# Patient Record
Sex: Male | Born: 1950 | Race: White | Hispanic: No | Marital: Married | Smoking: Never smoker
Health system: Southern US, Community
[De-identification: ages and names within clinical notes are randomized; demographics above are authoritative.]

## PROBLEM LIST (undated history)

## (undated) DIAGNOSIS — J302 Other seasonal allergic rhinitis: Secondary | ICD-10-CM

## (undated) HISTORY — DX: Other seasonal allergic rhinitis: J30.2

---

## 1998-07-25 HISTORY — PX: CHOLECYSTECTOMY: SHX55

## 2004-10-18 ENCOUNTER — Ambulatory Visit: Payer: Self-pay | Admitting: Family Medicine

## 2010-05-19 ENCOUNTER — Emergency Department: Payer: Self-pay | Admitting: Emergency Medicine

## 2012-02-18 ENCOUNTER — Inpatient Hospital Stay: Payer: Self-pay | Admitting: Internal Medicine

## 2012-02-18 LAB — BASIC METABOLIC PANEL
Anion Gap: 7 (ref 7–16)
Anion Gap: 8 (ref 7–16)
BUN: 20 mg/dL — ABNORMAL HIGH (ref 7–18)
Calcium, Total: 7.4 mg/dL — ABNORMAL LOW (ref 8.5–10.1)
Calcium, Total: 7.6 mg/dL — ABNORMAL LOW (ref 8.5–10.1)
Co2: 24 mmol/L (ref 21–32)
Co2: 25 mmol/L (ref 21–32)
Creatinine: 1.08 mg/dL (ref 0.60–1.30)
EGFR (African American): >60
EGFR (Non-African Amer.): >60
EGFR (Non-African Amer.): >60
Glucose: 119 mg/dL — ABNORMAL HIGH (ref 65–99)
Osmolality: 287 (ref 275–301)
Potassium: 4.4 mmol/L (ref 3.5–5.1)
Potassium: 4.8 mmol/L (ref 3.5–5.1)
Sodium: 142 mmol/L (ref 136–145)

## 2012-02-18 LAB — CBC
HCT: 34.3 % — ABNORMAL LOW (ref 40.0–52.0)
MCV: 90 fL (ref 80–100)
RBC: 3.81 10*6/uL — ABNORMAL LOW (ref 4.40–5.90)
RDW: 13 % (ref 11.5–14.5)
WBC: 12.4 10*3/uL — ABNORMAL HIGH (ref 3.8–10.6)

## 2012-02-18 LAB — HEMOGLOBIN
HGB: 10.2 g/dL — ABNORMAL LOW (ref 13.0–18.0)
HGB: 9.4 g/dL — ABNORMAL LOW (ref 13.0–18.0)

## 2012-02-18 LAB — APTT: Activated PTT: 24.2 secs (ref 23.6–35.9)

## 2012-02-18 LAB — PLATELET COUNT: Platelet: 236 10*3/uL (ref 150–440)

## 2012-02-19 LAB — URINALYSIS, COMPLETE
Bacteria: NONE SEEN
Bilirubin,UR: NEGATIVE
Glucose,UR: NEGATIVE mg/dL (ref 0–75)
Leukocyte Esterase: NEGATIVE
RBC,UR: NONE SEEN /HPF (ref 0–5)
Squamous Epithelial: NONE SEEN
WBC UR: <1 /HPF (ref 0–5)

## 2012-02-19 LAB — BASIC METABOLIC PANEL
Anion Gap: 6 — ABNORMAL LOW (ref 7–16)
BUN: 8 mg/dL (ref 7–18)
Chloride: 114 mmol/L — ABNORMAL HIGH (ref 98–107)
Co2: 27 mmol/L (ref 21–32)
Creatinine: 0.9 mg/dL (ref 0.60–1.30)
EGFR (Non-African Amer.): >60
Glucose: 93 mg/dL (ref 65–99)
Osmolality: 290 (ref 275–301)
Potassium: 3.9 mmol/L (ref 3.5–5.1)

## 2012-02-19 LAB — HEMOGLOBIN: HGB: 9.4 g/dL — ABNORMAL LOW (ref 13.0–18.0)

## 2012-02-20 LAB — HEMOGLOBIN: HGB: 9.1 g/dL — ABNORMAL LOW (ref 13.0–18.0)

## 2013-05-19 ENCOUNTER — Emergency Department: Payer: Self-pay | Admitting: Emergency Medicine

## 2013-05-19 LAB — COMPREHENSIVE METABOLIC PANEL
Albumin: 4.1 g/dL (ref 3.4–5.0)
BUN: 12 mg/dL (ref 7–18)
Calcium, Total: 9.2 mg/dL (ref 8.5–10.1)
Co2: 26 mmol/L (ref 21–32)
Creatinine: 1.14 mg/dL (ref 0.60–1.30)
EGFR (African American): >60
Osmolality: 277 (ref 275–301)
Potassium: 4.1 mmol/L (ref 3.5–5.1)
SGOT(AST): 19 U/L (ref 15–37)
SGPT (ALT): 20 U/L (ref 12–78)
Sodium: 139 mmol/L (ref 136–145)

## 2013-05-19 LAB — CBC
HGB: 15.4 g/dL (ref 13.0–18.0)
MCH: 31.1 pg (ref 26.0–34.0)
MCHC: 34.9 g/dL (ref 32.0–36.0)
MCV: 89 fL (ref 80–100)
RDW: 12.7 % (ref 11.5–14.5)
WBC: 6.9 10*3/uL (ref 3.8–10.6)

## 2013-05-19 LAB — URINALYSIS, COMPLETE
Bilirubin,UR: NEGATIVE
Blood: NEGATIVE
Nitrite: NEGATIVE
Ph: 6 (ref 4.5–8.0)
RBC,UR: NONE SEEN /HPF (ref 0–5)
Squamous Epithelial: <1
WBC UR: 1 /HPF (ref 0–5)

## 2014-02-11 DIAGNOSIS — N4341 Spermatocele of epididymis, single: Secondary | ICD-10-CM | POA: Insufficient documentation

## 2014-03-24 DIAGNOSIS — K589 Irritable bowel syndrome without diarrhea: Secondary | ICD-10-CM | POA: Insufficient documentation

## 2014-03-24 DIAGNOSIS — K2100 Gastro-esophageal reflux disease with esophagitis, without bleeding: Secondary | ICD-10-CM | POA: Insufficient documentation

## 2014-07-25 HISTORY — PX: HERNIA REPAIR: SHX51

## 2014-09-30 DIAGNOSIS — N400 Enlarged prostate without lower urinary tract symptoms: Secondary | ICD-10-CM | POA: Insufficient documentation

## 2014-11-11 NOTE — Consult Note (Signed)
PATIENT NAME:  Javier Archer, Javier Archer MR#:  [id number] DATE OF BIRTH:  05/13/1951  DATE OF CONSULTATION:  02/19/2012  CONSULTING PHYSICIAN:  Lupita Dawn. Candace Cruise, MD  REASON FOR REFERRAL: Lower GI bleeding.   HISTORY OF PRESENT ILLNESS: The patient is a 64 year old white male who had a routine colonoscopy on July 26th by Dr. Melina Copa in [place] for a screening colonoscopy. At that time, the patient had two 8 mm polyps removed from the ascending colon. The patient tolerated the procedure well. The patient resumed taking a baby aspirin that night. Then around 6:00 in the evening, the patient had some urgency and started passing a large amount of blood per rectum. The patient had a total of 6 bowel movements from 6 to 9:30 p.m. By this time, the patient was found to feel jittery and sweaty. He felt lightheaded and dizzy. He initially contacted Dr. Melina Copa. When the bleeding continued, he came to the Emergency Room for further evaluation.   The patient was found to be hypotensive with a systolic blood pressure in the low 90s. Whenever he would stand or sit up he would feel more dizzy. Because of symptomatic GI bleeding, the patient was admitted for further evaluation and treatment.   REVIEW OF SYSTEMS: The patient has no fevers or chills. He does complain of weakness. He denied having any nausea or vomiting. There is no abdominal pain or cramping. Since 09:30 last night, the patient has had no further bleeding. There is no chest pain or palpitations. There is no coughing or shortness of breath. There is no weight change.   PAST MEDICAL HISTORY: The patient has no significant past medical history. He does have a history of hiatal hernia.   PAST SURGICAL HISTORY:  1. Cholecystectomy.  2. Appendectomy.   HOME MEDICATIONS:  None.   ALLERGIES: He has no known drug allergies.   FAMILY HISTORY: Notable for a myocardial infarction in his uncle.   SOCIAL HISTORY: He is semiretired. He quit smoking in 1981. He drinks beer  occasionally.   PHYSICAL EXAMINATION:  GENERAL: The patient is in no acute distress right now.   VITAL SIGNS: His vital signs are actually stable. His blood pressure is back to normal.   HEENT: Head: Normocephalic, atraumatic head. Eyes: Pupils are equally reactive. Throat is clear.   NECK: Supple.   CARDIAC: Exam showed regular rhythm and rate without murmurs.   LUNGS: Lungs are clear bilaterally.   ABDOMEN: Normoactive bowel sounds, soft and nontender. No hepatomegaly. There are no palpable masses. He had active bowel sounds.   EXTREMITIES: No clubbing, cyanosis, or edema.   NEUROLOGIC: Examination is nonfocal.   SKIN: Examination is grossly normal.   LABORATORY, DIAGNOSTIC AND RADIOLOGICAL DATA: Initial count is 12.4. Hemoglobin was 11.9 on admission. It did go down to 10.2 two hours later. The patient did not require blood transfusion. Sodium 145, potassium 4.4, chloride 112, BUN 23, creatinine 1.08, glucose 130. INR is normal. Urinalysis was negative.   IMPRESSION: This is a patient with likely postpolypectomy bleeding that is symptomatic. It may have been exacerbated by resuming aspirin the night of the colonoscopy. It appears that the bleeding has stopped already.   RECOMMENDATIONS: Continue to hydrate the patient and monitor his hemoglobin. If he keeps on dropping, then we may need to transfuse him. We will watch for any recurrence of bleeding. If there is recurrence of bleeding, then he will be prepped for a colonoscopy. The patient will be placed on a clear liquid diet. On the  other hand, if there is no further bleeding, then the patient will be discharged to home.  I did discuss the case with Dr. Melina Copa, who performed the colonoscopy on Thursday.   Thank you for the referral.   ____________________________ Lupita Dawn. Candace Cruise, MD pyo:cbb D: 02/19/2012 07:16:17 ET T: 02/19/2012 11:37:08 ET JOB#: [id number]  cc: Lupita Dawn. Candace Cruise, MD, <Dictator> Lupita Dawn Jozlin Bently MD ELECTRONICALLY SIGNED  02/20/2012 13:53

## 2014-11-11 NOTE — Consult Note (Signed)
Bleeding stopped. Hgb stable. Colonoscopy showed ext hemorrhoids and extensive tics in sigmoid and ascending colon. No bleeding anywhere. No signs of bleeding from polypectomy site. Suspect bleeding from tics and not from polypectomy site. High fiber diet ordered. Anusol cream bid. Javier Archer for discharge today. F/U with Dr. Melina Copa. Will sign off. Thanks.  Electronic Signatures: Verdie Shire (MD)  (Signed on 29-Jul-13 11:37)  Authored  Last Updated: 29-Jul-13 11:37 by Verdie Shire (MD)

## 2014-11-11 NOTE — Discharge Summary (Signed)
PATIENT NAME:  Javier Archer, Javier Archer MR#:  [id number] DATE OF BIRTH:  [birth year]  DATE OF ADMISSION:  02/18/2012 DATE OF DISCHARGE:  02/20/2012  ADMITTING DIAGNOSIS: Gastrointestinal bleed.   DISCHARGE DIAGNOSES:  1. Hypertension. 2. Presyncope. 3. Lower gastrointestinal bleed status post routine colonoscopy 02/16/2012 after which the patient had polyp removal, status post colonoscopy on 02/20/2012 at [facility] by Dr. Candace Cruise due to hematochezia where non-thrombosed external hemorrhoids were found on the perianal exam, also diverticulosis in the sigmoid colon and ascending colon. Examination was otherwise normal. Bleeding not from polypectomy but rather from tics/hemorrhoids. A high fiber diet as well as treatment of hemorrhoids was recommended as well as discharge patient to home. 4. Anemia from acute GI bleed.  DISCHARGE CONDITION: Stable.   DISCHARGE MEDICATIONS: The patient is to continue:  1. Omeprazole 40 mg p.o. daily. 2. He was also prescribed Anusol-HC rectal cream one application rectally twice daily.   DO NOT TAKE: The patient was advised not to take aspirin for seven days.   DIET: He is being discharged on high fiber diet, regular consistency.   ACTIVITY LIMITATIONS: As tolerated.   FOLLOW-UP:  1. Follow-up appointment with Dr. Melina Copa in two days after discharge. 2. Follow-up with Dr. Bridget Hartshorn in two days after discharge.    CONSULTANT: Dr. Candace Cruise    RADIOLOGIC STUDIES: None.   HISTORY OF PRESENT ILLNESS: The patient is a 64 year old Caucasian male with past medical history significant for history of recent colonoscopy status post polypectomy 02/18/2012 who presented to the hospital with GI bleed as well as hypotension. Please refer to Dr. Laurin Coder Gutierrez's admission note on 02/18/2012. Apparently the patient was discharged from routine colonoscopy one day prior to coming to the hospital and on day of admission he developed a large amount of blood mixed with feces.  He had at least six episodes of bleeding and presented to the hospital hypotensive with systolic blood pressure in the low 90's and he was also symptomatic, presyncopal. His blood pressure after rehydration was 108/67, temperature was 97.4, pulse was 60, respiratory rate was 18. Orthostatic vital signs were also positive and his blood pressure was as low as 90/60 and heart rate going up from 60's to 78 in upright position. His pulse oximetry revealed 98% on room air. His physical exam was unremarkable.   LAB DATA: Elevated glucose to 130. BUN was also elevated to 23, otherwise BMP was unremarkable. The patient's CBC showed elevated white blood cell count to 12.4, hemoglobin 11.9, platelet count 267. Coagulation panel was unremarkable. Pro-time 14.4. INR 1.1. Activated PTT 24.2.  HOSPITAL COURSE: The patient was admitted to the hospital for further evaluation. He was started on clear liquid diet as well as IV fluids and was rehydrated. His bleeding subsided, however, as soon as he was initiated on full liquid diet and subsequently a regular diet he developed bleeding again. He was evaluated by Dr. Candace Cruise who followed him along and because he rebled colonoscopy was recommended. The patient underwent repeat colonoscopy on 02/20/2012 by Dr. Candace Cruise which revealed non-thrombosed external hemorrhoids on perianal exam, diverticulosis of sigmoid colon as well as ascending colon. Examination was otherwise normal. Dr. Candace Cruise felt that the patient's bleeding was not from polypectomy but rather from hemorrhoids. He recommended to continue high fiber diet, treatment of hemorrhoids, and discharge the patient home. After rehydration the patient's vital signs were stable and he was stable to be discharged home. He was ambulated and did not have any symptoms. On the day of  discharge, the patient's vital signs revealed temperature 98.2, pulse 97, respiration rate 18, blood pressure 115/70, saturation 95 to 96% on room air at rest. Of note,  the patient did have mild posthemorrhagic anemia with hemoglobin level going down from 11.9 to 9.1 on the day of discharge, 02/20/2012. It is recommended to follow the patient's hemoglobin levels as outpatient and make sure that it is stable. The patient was also advised to keep his bowel movements soft and avoid constipation. Of note, the patient's white blood cell count was rechecked and it normalized. Also of note, the patient was receiving IV fluids with normal saline and his sodium level increased to 148 on 02/20/2012. However, as his oral intake improved and he was able to drink clear liquids, we did not expect him to have his sodium level elevated. However, it is recommended to check sodium level as outpatient and make sure that it returns back to normal physiologic levels.   TIME SPENT: 40 minutes.   ____________________________ Theodoro Grist, MD rv:drc D: 02/20/2012 20:25:26 ET T: 02/21/2012 14:38:57 ET JOB#: [id number]  cc: Theodoro Grist, MD, <Dictator> Manual Meier. Bridget Hartshorn, MD Theodoro Grist MD ELECTRONICALLY SIGNED 02/27/2012 1:04

## 2014-11-11 NOTE — Consult Note (Signed)
Call from ICU. 2 bloody BM's after resuming reg diet. BP stable. Hold discharge. Change back to liquid diet. Bowel prep for colonoscopy tomorrow. CBC in AM. Pt can be transferred to floor. THanks.  Electronic Signatures: Verdie Shire (MD)  (Signed on 28-Jul-13 10:10)  Authored  Last Updated: 28-Jul-13 10:10 by Verdie Shire (MD)

## 2014-11-11 NOTE — Consult Note (Signed)
Chief Complaint:   Subjective/Chief Complaint Feels much better. Small amount of bleeding around 5 PM yesterday. No longer feels lightedheaded or dizzy. SBP now normal. Hgb 9.4.   VITAL SIGNS/ANCILLARY NOTES: **Vital Signs.:   28-Jul-13 07:00   Vital Signs Type Routine   Temperature Temperature (F) 98.4   Celsius 36.8   Temperature Source oral   Pulse Pulse 76   Pulse source if not from Vital Sign Device per cardiac monitor   Respirations Respirations 22   Systolic BP Systolic BP 98   Diastolic BP (mmHg) Diastolic BP (mmHg) 71   Mean BP 80   Pulse Ox % Pulse Ox % 99   Oxygen Delivery Room Air/ 21 %   Pulse Ox Heart Rate 76   Brief Assessment:   Cardiac Regular    Respiratory clear BS    Gastrointestinal Normal   Lab Results: Routine Chem:  28-Jul-13 04:27    Glucose, Serum 93   BUN 8   Creatinine (comp) 0.90   Sodium, Serum  147   Potassium, Serum 3.9   Chloride, Serum  114   CO2, Serum 27   Calcium (Total), Serum  7.7   Anion Gap  6   Osmolality (calc) 290   eGFR (African American) >60   eGFR (Non-African American) >60 (eGFR values <60m/min/1.73 m2 may be an indication of chronic kidney disease (CKD). Calculated eGFR is useful in patients with stable renal function. The eGFR calculation will not be reliable in acutely ill patients when serum creatinine is changing rapidly. It is not useful in  patients on dialysis. The eGFR calculation may not be applicable to patients at the low and high extremes of body sizes, pregnant women, and vegetarians.)  Routine UA:  28-Jul-13 04:37    Color (UA) Straw   Clarity (UA) Clear   Glucose (UA) Negative   Bilirubin (UA) Negative   Ketones (UA) Negative   Specific Gravity (UA) 1.006   Blood (UA) Negative   pH (UA) 5.0   Protein (UA) Negative   Nitrite (UA) Negative   Leukocyte Esterase (UA) Negative (Result(s) reported on 19 Feb 2012 at 06:14AM.)   RBC (UA) NONE SEEN   WBC (UA) <1 /HPF   Bacteria (UA) NONE SEEN    Epithelial Cells (UA) NONE SEEN   Mucous (UA) PRESENT (Result(s) reported on 19 Feb 2012 at 06:14AM.)  Routine Hem:  28-Jul-13 04:27    Hemoglobin (CBC)  9.4 (Result(s) reported on 19 Feb 2012 at 07:33AM.)   WBC (CBC) 6.0 (Result(s) reported on 19 Feb 2012 at 05:00AM.)   Assessment/Plan:  Assessment/Plan:   Assessment Likely polypectomy bleeding. Appears to have stopped.    Plan Reg diet ordered. If no further bleeding, ok for discharge to home with f/u with Dr. BMelina Copa Hold bASA for at least a week. Thanks.   Electronic Signatures: OVerdie Shire(MD)  (Signed 28-Jul-13 09:09)  Authored: Chief Complaint, VITAL SIGNS/ANCILLARY NOTES, Brief Assessment, Lab Results, Assessment/Plan   Last Updated: 28-Jul-13 09:09 by OVerdie Shire(MD)

## 2014-11-11 NOTE — H&P (Signed)
PATIENT NAME:  Javier Archer, FARRINGTON MR#:  [id number] DATE OF BIRTH:  01-12-1951  DATE OF ADMISSION:  02/18/2012   GI DOCTOR: Dr. Melina Copa, phone [phone]  CONSULTING DOCTOR ON THIS CASE: Dr. Verdie Shire   REASON FOR ADMISSION: Symptomatic lower GI bleeding with hypotension.  HISTORY OF PRESENT ILLNESS: Javier Archer is a very nice 64 year old gentleman who is a healthy individual, very active, semi-retired, who works at his farm at the moment. He had his routine colonoscopy done yesterday by Dr. Melina Copa without any complications. He was discharged home and he's been his normal self up to yesterday at 6 p.m. He felt like he needed to have a bowel movement with mild urgency. He went to the bathroom and had a significantly large amount of blood mixed with feces. After that he had six more bowel movements which were bloody. He did not have any melena prior to this or any other GI problems. After the first bowel movement he started to feel really warm, jittery, and sweaty. On getting off of the toilet he became lightheaded and dizzy. He was able to contact Dr. Melina Copa who gave him some recommendations but his problem started getting worse and he went to the bathroom six more times and his lightheadedness became more evident and the patient was recommended to come to the ER. He denies any abdominal pain. No prior constipation or diarrhea. He denies any nausea or vomiting. He has not had anything to eat since prior to bowel movement but he did not have any problems eating prior to that. No chest pain, shortness of breath, or orthopnea at the moment.   The patient presented to the ER. His blood pressure has been in the low 90's and the patient has been very symptomatic. He received one liter of NS on the way over here by EMS and in the ER he has received about two liters IV. His blood pressure is starting to pick up. Last blood pressure was 112/70 and he is starting to feel a little bit better. He has been typed and crossed and  his labs have been sent but they are not back. They needed to be repeated due to sample being clotted.   PAST MEDICAL HISTORY: The patient is mostly healthy. He has a history of hiatal hernia but is mostly asymptomatic.   ALLERGIES: No known drug allergies.  PAST SURGICAL HISTORY: Status post cholecystectomy and appendectomy.  HOME MEDICATIONS: None.  FAMILY HISTORY: The patient states that one of his uncles died from MI but denies any cancer, strokes, or peripheral vascular disease.   SOCIAL HISTORY: The patient lives with his wife. 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 The patient states that he quit smoking in 1981. He smoked probably for 10 years at less than a pack a day. The patient states that he drinks beer occasionally, maybe once a week.   REVIEW OF SYSTEMS: CONSTITUTIONAL: Denies any fever, fatigue, or weakness. Positive for dizziness, lightheadedness today. Denies any weight loss or weight gain. EYES: Denies any blurry vision, cataracts, or glaucoma. ENT: Denies any tinnitus, significant hearing loss, epistaxis, injection of conjunctivae, or problems with his oral mucosa. RESPIRATORY: Denies any cough, wheezing, dyspnea, or hemoptysis. CARDIOVASCULAR: Denies any chest pain, orthopnea, paroxysmal nocturnal dyspnea. No syncopal episodes although the patient was lightheaded today with significant orthostatic hypotension. GI: Denies any abdominal pain, constipation, or diarrhea. Positive for GI bleeding today as mentioned above. Positive for increased frequency today but none prior to  today. GU: Denies any dysuria, hematuria, kidney stones. No prostatitis. ENDOCRINE: No polyuria, polydipsia, polyphagia. No cold or heat intolerance. HEMATOLOGIC: Positive for bleeding rectally today but no easy bruising or history of bleeding in the past. SKIN: No rashes, petechiae, or lesions. MUSCULOSKELETAL: The patient denies any significant joint pain or significant back pain.  NEUROLOGIC: Denies any numbness, tingling, or dysarthria. PSYCHOLOGIC: Negative for depression or anxiety.   PHYSICAL EXAMINATION:   VITAL SIGNS: Blood pressure 108/67, temperature 97.4, pulse 60, respirations 18. Orthostatic vital signs have been done and they are positive with blood pressure as low as 90/60's, heart rate going from 60 up to 78. Pulse oximetry 98% on room air.   GENERAL: The patient is alert, oriented x3. He is in mild distress with jittering and a little bit nervous but overall stable. He is laying down on the ER stretcher and he doesn't seem to be in any pain at the moment.   HEENT: Pupils are equal, round, and reactive. Extraocular movements are intact. Mucosa moist. No oral lesions. Anicteric sclerae. Pink conjunctivae. Head normocephalic and atraumatic.   NECK: Supple. No JVD. No thyromegaly. No adenopathy. Trachea is central.   CARDIOVASCULAR: Regular rate and rhythm. No murmurs, rubs, or gallops.   LUNGS: Clear without any wheezing or crepitus. Good air entrance bilaterally. No use of accessory muscles.  ABDOMEN: Soft, nontender, and nondistended. No hepatosplenomegaly. No masses. Bowel sounds are positive. Negative rebound. Negative guarding.   RECTAL: Deferred although the patient has had significant bowel movements with blood.   GENITAL: Deferred.   EXTREMITIES: No edema, no cyanosis, no clubbing. Deep tendon reflexes +2.  NEUROLOGIC: Cranial nerves II through XII are intact. Neurologic exam grossly normal and nonfocal.   SKIN: Pale tone around the face and distal upper and lower extremities but no significant cyanosis. Capillary refill is around 4 to 5 seconds. Pulses are weak, +1.   PSYCHIATRIC: Negative for anxiety or agitation.   LABORATORY, DIAGNOSTIC, AND RADIOLOGICAL DATA: Lab work is still pending. The sample has been sent but it was hemolyzed and that was repeated and is pending. A unit of blood has been ordered regardless of the hemoglobin due to  significant hypotension as per recommendation of Dr. Melina Copa.   ASSESSMENT AND PLAN: 1. Lower GI bleeding. The patient is a healthy 64 year old gentleman who had a routine colonoscopy done yesterday by Dr. Melina Copa. The patient didn't have any complication, went home and today he has had about six bowel movements with a significant amount of blood. The patient is symptomatic, dizzy, lightheaded with positive orthostatics and he is very jittery and feeling very bad. At the moment he is trying to get more stable after two liters of saline solution but he's still dizzy and lightheaded. The patient is going to be put in CCU tonight. I think that he's getting better and he will be alright but due to the fact that he was so symptomatic and hypotensive I want him to be watched closely at least for tonight with CBCs every eight hours and vital signs at least every two hours. Tomorrow he may be able to be transferred to the regular floor. Dr. Candace Cruise has been consulted on the case and he spoke with the ER doctor directly about the case. The patient is to remain n.p.o. for possible procedure if necessary in the morning. 2. Acute blood loss anemia/symptomatic anemia. The patient is to be transfused. 3. Hypotension. The patient is with significant low blood pressures in the low 90's. He  has not been really tachycardic although his heart rate jumped from the 60's to 78 or so during orthostatic maneuvers. IV fluids have been given and the patient is to remain on maintenance IV fluids at 125 an hour and they are to be cut in half during the time of the transfusion.  4. DVT prophylaxis. The patient is healthy and I think with just TED hose he will do alright.  5. GI prophylaxis. We can add Protonix IV and will switch it to p.o. once the patient is taking p.o.  CODE STATUS: The patient is FULL CODE.  I had a long conversation with the patient and the family. The patient and family has been explained all the circumstances.  TIME  SPENT WITH THE PATIENT: About 40 minutes.   ____________________________ [place] Sink, MD rsg:drc D: 02/18/2012 01:11:11 ET T: 02/18/2012 07:50:33 ET JOB#: [id number]  cc: [place] Sink, MD, <Dictator> Arturo Freundlich America Brown MD ELECTRONICALLY SIGNED 03/10/2012 13:04

## 2014-11-11 NOTE — Consult Note (Signed)
Pt seen and examined. Full consult to follow. Routine screening colonoscopy done on Thurs. Had 2, 16mm polyps removed from ascending colon. Resume bASA Thurs night. Yesterday evening had at least 6 bloodcy BM's assoc with weakness, dizziness, etc. Hypotensive in ER. Much better today with IVF. Hgb above 11 last night. No bleeding since 930 PM. Still hypotensive. Continue IVF. CLear liquid diet. Moniter hgb and transfuse as needed. If further bleeding today, then bowel prep for colonoscopy tomorrow. Otherwise, advance diet as tolerated if no further bleeding. Hold ASA for now. Discussed case with Dr. Melina Copa, who performed the colonoscopy on THurs.THanks.    Electronic Signatures: Verdie Shire (MD) (Signed on 27-Jul-13 10:43)  Authored   Last Updated: 28-Jul-13 07:10 by Verdie Shire (MD)

## 2017-04-11 ENCOUNTER — Other Ambulatory Visit: Payer: Self-pay | Admitting: Internal Medicine

## 2017-04-11 DIAGNOSIS — R109 Unspecified abdominal pain: Secondary | ICD-10-CM

## 2017-04-13 ENCOUNTER — Ambulatory Visit
Admission: RE | Admit: 2017-04-13 | Discharge: 2017-04-13 | Disposition: A | Discharge status: Home / Self Care | Payer: Medicare Other | Source: Ambulatory Visit | Attending: Internal Medicine | Admitting: Internal Medicine

## 2017-04-13 DIAGNOSIS — R109 Unspecified abdominal pain: Secondary | ICD-10-CM | POA: Insufficient documentation

## 2017-04-13 DIAGNOSIS — Z9049 Acquired absence of other specified parts of digestive tract: Secondary | ICD-10-CM | POA: Insufficient documentation

## 2017-04-13 DIAGNOSIS — M47816 Spondylosis without myelopathy or radiculopathy, lumbar region: Secondary | ICD-10-CM | POA: Insufficient documentation

## 2017-04-13 DIAGNOSIS — N433 Hydrocele, unspecified: Secondary | ICD-10-CM | POA: Diagnosis not present

## 2018-05-24 DIAGNOSIS — Z86018 Personal history of other benign neoplasm: Secondary | ICD-10-CM

## 2018-05-24 HISTORY — DX: Personal history of other benign neoplasm: Z86.018

## 2019-12-18 ENCOUNTER — Other Ambulatory Visit: Payer: Self-pay | Admitting: Internal Medicine

## 2019-12-18 DIAGNOSIS — Z0000 Encounter for general adult medical examination without abnormal findings: Secondary | ICD-10-CM

## 2019-12-18 DIAGNOSIS — Z136 Encounter for screening for cardiovascular disorders: Secondary | ICD-10-CM

## 2019-12-19 ENCOUNTER — Other Ambulatory Visit (HOSPITAL_COMMUNITY): Payer: Self-pay | Admitting: Internal Medicine

## 2019-12-19 ENCOUNTER — Other Ambulatory Visit: Payer: Self-pay | Admitting: Internal Medicine

## 2019-12-19 DIAGNOSIS — Z136 Encounter for screening for cardiovascular disorders: Secondary | ICD-10-CM

## 2019-12-19 DIAGNOSIS — Z87891 Personal history of nicotine dependence: Secondary | ICD-10-CM

## 2020-06-11 ENCOUNTER — Emergency Department: Payer: Medicare Other

## 2020-06-11 ENCOUNTER — Other Ambulatory Visit: Payer: Self-pay

## 2020-06-11 ENCOUNTER — Ambulatory Visit (INDEPENDENT_AMBULATORY_CARE_PROVIDER_SITE_OTHER): Payer: Medicare Other | Admitting: Dermatology

## 2020-06-11 ENCOUNTER — Emergency Department
Admission: EM | Admit: 2020-06-11 | Discharge: 2020-06-11 | Disposition: A | Discharge status: Home / Self Care | Payer: Medicare Other | Attending: Emergency Medicine | Admitting: Emergency Medicine

## 2020-06-11 ENCOUNTER — Encounter: Payer: Self-pay | Admitting: Dermatology

## 2020-06-11 DIAGNOSIS — Z86018 Personal history of other benign neoplasm: Secondary | ICD-10-CM

## 2020-06-11 DIAGNOSIS — L570 Actinic keratosis: Secondary | ICD-10-CM

## 2020-06-11 DIAGNOSIS — L814 Other melanin hyperpigmentation: Secondary | ICD-10-CM

## 2020-06-11 DIAGNOSIS — R0789 Other chest pain: Secondary | ICD-10-CM | POA: Diagnosis not present

## 2020-06-11 DIAGNOSIS — L821 Other seborrheic keratosis: Secondary | ICD-10-CM | POA: Diagnosis not present

## 2020-06-11 DIAGNOSIS — L578 Other skin changes due to chronic exposure to nonionizing radiation: Secondary | ICD-10-CM

## 2020-06-11 DIAGNOSIS — R142 Eructation: Secondary | ICD-10-CM | POA: Insufficient documentation

## 2020-06-11 DIAGNOSIS — D229 Melanocytic nevi, unspecified: Secondary | ICD-10-CM

## 2020-06-11 DIAGNOSIS — R079 Chest pain, unspecified: Secondary | ICD-10-CM | POA: Diagnosis present

## 2020-06-11 DIAGNOSIS — D1800 Hemangioma unspecified site: Secondary | ICD-10-CM

## 2020-06-11 DIAGNOSIS — Z1283 Encounter for screening for malignant neoplasm of skin: Secondary | ICD-10-CM

## 2020-06-11 DIAGNOSIS — K449 Diaphragmatic hernia without obstruction or gangrene: Secondary | ICD-10-CM

## 2020-06-11 DIAGNOSIS — L720 Epidermal cyst: Secondary | ICD-10-CM | POA: Diagnosis not present

## 2020-06-11 LAB — CBC
HCT: 41.4 % (ref 39.0–52.0)
Hemoglobin: 14.1 g/dL (ref 13.0–17.0)
MCH: 31 pg (ref 26.0–34.0)
MCHC: 34.1 g/dL (ref 30.0–36.0)
MCV: 91 fL (ref 80.0–100.0)
Platelets: 290 10*3/uL (ref 150–400)
RBC: 4.55 MIL/uL (ref 4.22–5.81)
RDW: 12.1 % (ref 11.5–15.5)
WBC: 8.2 10*3/uL (ref 4.0–10.5)
nRBC: 0 % (ref 0.0–0.2)

## 2020-06-11 LAB — TROPONIN I (HIGH SENSITIVITY)
Troponin I (High Sensitivity): <2 ng/L (ref <18)
Troponin I (High Sensitivity): 3 ng/L (ref <18)

## 2020-06-11 LAB — BASIC METABOLIC PANEL
Anion gap: 11 (ref 5–15)
BUN: 20 mg/dL (ref 8–23)
CO2: 26 mmol/L (ref 22–32)
Calcium: 9.2 mg/dL (ref 8.9–10.3)
Chloride: 104 mmol/L (ref 98–111)
Creatinine, Ser: 1.14 mg/dL (ref 0.61–1.24)
GFR, Estimated: >60 mL/min (ref >60)
Glucose, Bld: 105 mg/dL — ABNORMAL HIGH (ref 70–99)
Potassium: 3.8 mmol/L (ref 3.5–5.1)
Sodium: 141 mmol/L (ref 135–145)

## 2020-06-11 MED ORDER — LIDOCAINE VISCOUS HCL 2 % MT SOLN
15.0000 mL | Freq: Once | OROMUCOSAL | Status: AC
  Administered 2020-06-11: 15 mL via ORAL
  Filled 2020-06-11: qty 15

## 2020-06-11 MED ORDER — ALUM & MAG HYDROXIDE-SIMETH 200-200-20 MG/5ML PO SUSP
30.0000 mL | Freq: Once | ORAL | Status: AC
  Administered 2020-06-11: 30 mL via ORAL
  Filled 2020-06-11: qty 30

## 2020-06-11 NOTE — ED Provider Notes (Signed)
[name] Emergency Department Provider Note ____________________________________________   First MD Initiated Contact with Patient 06/11/20 1730     (approximate)  I have reviewed the triage vital signs and the nursing notes.  HISTORY  Chief Complaint Chest Pain   HPI Javier Archer is a 69 y.o. malewho presents to the ED for evaluation of chest pain.   Chart review indicates hx patient recently established with cardiology, seeing Dr. Etta Quill NP [place] [facility] 1 month ago.  At that time he was reporting intermittent chest pains with belching sensations.  He is scheduled for a stress test which has not been performed yet. History of hiatal hernia, BPH, GERD and former tobacco smoker.  Patient presents to the ED with about 1 month of acute on chronic chest pain.  Indicates left-sided chest pain that is nonradiating, aching and burning in nature with associated belching and burping sensation.  Worse after eating solid foods, improved after belching.    He denies any exertional pain, pleuritic pain, fevers, cough, shortness of breath, syncope or trauma.  He reports previously having a GI physician, but has not seen one in many years.  Reports scheduling an upcoming stress test for his heart.  Reports compliance with his home omeprazole.   Past Medical History:  Diagnosis Date  . Hx of dysplastic nevus 05/24/2018    right low back spinal inf, moderate  . Hx of dysplastic nevus 05/24/2018   right low back 6.0 cm lat to spine, moderate  . Hx of dysplastic nevus 05/24/2018   right low back 7.5 cm lat to spine  . Seasonal allergies     There are no problems to display for this patient.   Past Surgical History:  Procedure Laterality Date  . CHOLECYSTECTOMY  2000  . HERNIA REPAIR  2016    Prior to Admission medications   Not on File    Allergies Patient has no known allergies.  History reviewed. No pertinent family history.  Social  History Social History   Tobacco Use  . Smoking status: Never Smoker  . Smokeless tobacco: Never Used  Substance Use Topics  . Alcohol use: Yes  . Drug use: Never    Review of Systems  Constitutional: No fever/chills Eyes: No visual changes. ENT: No sore throat. Cardiovascular: Positive for chest pain Respiratory: Denies shortness of breath. Gastrointestinal: No abdominal pain.  No nausea, no vomiting.  No diarrhea.  No constipation. Genitourinary: Negative for dysuria. Musculoskeletal: Negative for back pain. Skin: Negative for rash. Neurological: Negative for headaches, focal weakness or numbness.  ____________________________________________   PHYSICAL EXAM:  VITAL SIGNS: Vitals:   06/11/20 1830 06/11/20 2129  BP: 109/62 116/81  Pulse: 72 60  Resp: 17 18  Temp:  97.6 F (36.4 C)  SpO2: 99% 99%     Constitutional: Alert and oriented. Well appearing and in no acute distress. Eyes: Conjunctivae are normal. PERRL. EOMI. Head: Atraumatic. Nose: No congestion/rhinnorhea. Mouth/Throat: Mucous membranes are moist.  Oropharynx non-erythematous. Neck: No stridor. No cervical spine tenderness to palpation. Cardiovascular: Normal rate, regular rhythm. Grossly normal heart sounds.  Good peripheral circulation. Respiratory: Normal respiratory effort.  No retractions. Lungs CTAB. Gastrointestinal: Soft , nondistended, nontender to palpation. No CVA tenderness.  Benign abdomen throughout. Musculoskeletal: No lower extremity tenderness nor edema.  No joint effusions. No signs of acute trauma. Neurologic:  Normal speech and language. No gross focal neurologic deficits are appreciated. No gait instability noted. Skin:  Skin is warm, dry and intact. No  rash noted. Psychiatric: Mood and affect are normal. Speech and behavior are normal.  ____________________________________________   LABS (all labs ordered are listed, but only abnormal results are displayed)  Labs Reviewed   BASIC METABOLIC PANEL - Abnormal; Notable for the following components:      Result Value   Glucose, Bld 105 (*)    All other components within normal limits  CBC  TROPONIN I (HIGH SENSITIVITY)  TROPONIN I (HIGH SENSITIVITY)   ____________________________________________  12 Lead EKG  Sinus rhythm, rate of 59 bpm.  Normal axis and intervals.  No evidence of acute ischemia. ____________________________________________  RADIOLOGY  ED MD interpretation: 2 view CXR reviewed by me without evidence of acute cardiopulmonary pathology.  Official radiology report(s): DG Chest 2 View  Result Date: 06/11/2020 CLINICAL DATA:  69 year old male with chest pain. EXAM: CHEST - 2 VIEW COMPARISON:  Chest radiograph dated 05/19/2010. FINDINGS: No focal consolidation, pleural effusion, pneumothorax. The cardiac silhouette is within limits. No acute osseous pathology. Degenerative changes of the spine. IMPRESSION: No active cardiopulmonary disease. Electronically Signed   By: Anner Crete M.D.   On: 06/11/2020 16:48    ____________________________________________   PROCEDURES and INTERVENTIONS  Procedure(s) performed (including Critical Care):  .1-3 Lead EKG Interpretation Performed by: Vladimir Crofts, MD Authorized by: Vladimir Crofts, MD     Interpretation: normal     ECG rate:  70   ECG rate assessment: normal     Rhythm: sinus rhythm     Ectopy: none     Conduction: normal      Medications  alum & mag hydroxide-simeth (MAALOX/MYLANTA) 200-200-20 MG/5ML suspension 30 mL (30 mLs Oral Given 06/11/20 1859)    And  lidocaine (XYLOCAINE) 2 % viscous mouth solution 15 mL (15 mLs Oral Given 06/11/20 1900)    ____________________________________________   MDM / ED COURSE   69 year old male with known hiatal hernia presenting with acute on chronic chest pains, likely GI etiology, without evidence of ACS and amenable to outpatient management.  Normal vitals on room air.  Exam reassuring  without evidence of acute pathology.  He has a benign abdomen, no evidence of neurovascular deficits and is in no acute distress.  Blood work is reassuring without any significant derangements.  His EKG is nonischemic and has 2 - troponins.  He has improving symptoms after a GI cocktail.  Considering his associated symptoms of belching, improved with passing gas and pain is worse postprandially, he is likely feeling his hiatal hernia.  We discussed following up with GI for repeat endoscopy, and I urged him to continue his omeprazole as an outpatient.  Further urged him to follow-up with his cardiologist to ensure he gets cardiac stress testing, despite a benign work-up here in the ED.  We discussed return precautions for the ED and patient is medically stable for discharge home.   Clinical Course as of Jun 12 2219  Thu Jun 11, 2020  1924 Reassessed.  Patient reports improving symptoms after GI cocktail.  Awaiting repeat troponin.   [DS]  2121 Reassessed.  Patient ports continues to feel okay.  Wife is now at the bedside and indicates that he has been taking omeprazole at home.  We discussed his benign cardiac testing and how his symptoms are likely related to his hiatal hernia.  We discussed following up with GI and we discussed return precautions for the ED.   [DS]    Clinical Course User Index [DS] Vladimir Crofts, MD    ____________________________________________   FINAL  CLINICAL IMPRESSION(S) / ED DIAGNOSES  Final diagnoses:  Other chest pain  Hiatal hernia     ED Discharge Orders    None       Acsa Estey   Note:  This document was prepared using Dragon voice recognition software and may include unintentional dictation errors.   Vladimir Crofts, MD 06/11/20 2222

## 2020-06-11 NOTE — Progress Notes (Signed)
Follow-Up Visit   Subjective  Javier Archer is a 69 y.o. male who presents for the following: Annual Exam (total body skin exam, hx of dysplastic nevi), check spot (L ear, few months), and growth (back, > 54m, tender). The patient presents for Total-Body Skin Exam (TBSE) for skin cancer screening and mole check.  Patient accompanied by wife who contributes to history.  The following portions of the chart were reviewed this encounter and updated as appropriate:  Tobacco  Allergies  Meds  Problems  Med Hx  Surg Hx  Fam Hx     Review of Systems:  No other skin or systemic complaints except as noted in HPI or Assessment and Plan.  Objective  Well appearing patient in no apparent distress; mood and affect are within normal limits.  A full examination was performed including scalp, head, eyes, ears, nose, lips, neck, chest, axillae, abdomen, back, buttocks, bilateral upper extremities, bilateral lower extremities, hands, feet, fingers, toes, fingernails, and toenails. All findings within normal limits unless otherwise noted below.  Objective  3.22mm cystic pap  R low back: 5.0cm cystic pap  R forehead: 3.43mm cystic pap  Upper back spinal: 2.0cm cystic pap  Objective  Right Ear x 1: Pink scaly macules    Assessment & Plan    Lentigines - Scattered tan macules - Discussed due to sun exposure - Benign, observe - Call for any changes  Seborrheic Keratoses - Stuck-on, waxy, tan-brown papules and plaques  - Discussed benign etiology and prognosis. - Observe - Call for any changes  Melanocytic Nevi - Tan-brown and/or pink-flesh-colored symmetric macules and papules - Benign appearing on exam today - Observation - Call clinic for new or changing moles - Recommend daily use of broad spectrum spf 30+ sunscreen to sun-exposed areas.   Hemangiomas - Red papules - Discussed benign nature - Observe - Call for any changes  Actinic Damage - Chronic, secondary to  cumulative UV/sun exposure - diffuse scaly erythematous macules with underlying dyspigmentation - Recommend daily broad spectrum sunscreen SPF 30+ to sun-exposed areas, reapply every 2 hours as needed.  - Call for new or changing lesions.  Skin cancer screening performed today.  History of Dysplastic Nevi - No evidence of recurrence today - Recommend regular full body skin exams - Recommend daily broad spectrum sunscreen SPF 30+ to sun-exposed areas, reapply every 2 hours as needed.  - Call if any new or changing lesions are noted between office visits - right low back spinal inferior,   Epidermal cyst (3) R forehead; R low back; Upper back spinal  Benign-appearing. Exam most consistent with an epidermal inclusion cyst. Discussed that a cyst is a benign growth that can grow over time and sometimes get irritated or inflamed. Recommend observation if it is not bothersome. Discussed option of surgical excision to remove it if it is growing, symptomatic, or other changes noted. Please call for new or changing lesions so they can be evaluated.    Will plan excision to R upper back spinal  AK (actinic keratosis) Right Ear x 1  Destruction of lesion - Right Ear x 1 Complexity: simple   Destruction method: cryotherapy   Informed consent: discussed and consent obtained   Timeout:  patient name, date of birth, surgical site, and procedure verified Lesion destroyed using liquid nitrogen: Yes   Region frozen until ice ball extended beyond lesion: Yes   Outcome: patient tolerated procedure well with no complications   Post-procedure details: wound care instructions given  Skin cancer screening right low back 6.0 cm lat to spine,  right low back 7.5 cm lat to spine,  Return for for surgery cyst Upper back spinal, 1 yr tBSE.   I, Othelia Pulling, RMA, am acting as scribe for Sarina Ser, MD .  Documentation: I have reviewed the above documentation for accuracy and completeness, and I  agree with the above.  Sarina Ser, MD

## 2020-06-11 NOTE — Discharge Instructions (Addendum)
As we discussed, there is no evidence of strain or damage to your heart.  Your symptoms are likely due to your hiatal hernia.  I have attached the phone number for one of our local GI physicians, please call his clinic in the morning to set up a follow-up appointment to discuss repeat endoscopy to look at your hiatal hernia from the inside. Continue to take your omeprazole.  I would also urge you to follow-up with your cardiologist and the stress testing that they ordered for you.  While there is no signs of strain or damage in your heart today, it is important to follow-up on this risk.  If you develop any significantly worsening pain, especially different pain in your chest, please return to the ED.  Features that would be more concerning to me are chest pain with fever, chest pain with sweating bullets or chest pain with passing out. Regardless, if something feels different or wrong to you, please return to the ED.

## 2020-06-11 NOTE — ED Triage Notes (Addendum)
PT to ED for CP. PT has had intermittent CP for 1 month, dull in nature. However in the last couple days pt has noticed increased pain that does not go away and has been belching frequently. PT has  Tried OTC  Gas relief with no success. States hx of hiatal hernia that occasionally gives him pain but has never lasted this long. PT denies dizziness or [facility]

## 2020-06-11 NOTE — Patient Instructions (Signed)
   Pre-Operative Instructions  You are scheduled for a surgical procedure at [facility]. We recommend you read the following instructions. If you have any questions or concerns, please call the office at [phone].  1. Shower and wash the entire body with soap and water the day of your surgery paying special attention to cleansing at and around the planned surgery site.  2. Avoid aspirin or aspirin containing products at least fourteen (14) days prior to your surgical procedure and for at least one week (7 Days) after your surgical procedure. If you take aspirin on a regular basis for heart disease or history of stroke or for any other reason, we may recommend you continue taking aspirin but please notify us if you take this on a regular basis. Aspirin can cause more bleeding to occur during surgery as well as prolonged bleeding and bruising after surgery.   3. Avoid other nonsteroidal pain medications at least one week prior to surgery and at least one week prior to your surgery. These include medications such as Ibuprofen (Motrin, Advil and Nuprin), Naprosyn, Voltaren, Relafen, etc. If medications are used for therapeutic reasons, please inform us as they can cause increased bleeding or prolonged bleeding during and bruising after surgical procedures.   4. Please advise us if you are taking any "blood thinner" medications such as Coumadin or Dipyridamole or Plavix or similar medications. These cause increased bleeding and prolonged bleeding during procedures and bruising after surgical procedures. We may have to consider discontinuing these medications briefly prior to and shortly after your surgery if safe to do so.   5. Please inform us of all medications you are currently taking. All medications that are taken regularly should be taken the day of surgery as you always do. Nevertheless, we need to be informed of what medications you are taking prior to surgery to know whether they will  affect the procedure or cause any complications.   6. Please inform us of any medication allergies. Also inform us of whether you have allergies to Latex or rubber products or whether you have had any adverse reaction to Lidocaine or Epinephrine.  7. Please inform us of any prosthetic or artificial body parts such as artificial heart valve, joint replacements, etc., or similar condition that might require preoperative antibiotics.   8. We recommend avoidance of alcohol at least two weeks prior to surgery and continued avoidance for at least two weeks after surgery.   9. We recommend discontinuation of tobacco smoking at least two weeks prior to surgery and continued abstinence for at least two weeks after surgery.  10. Do not plan strenuous exercise, strenuous work or strenuous lifting for approximately four weeks after your surgery.   11. We request if you are unable to make your scheduled surgical appointment, please call us at least a week in advance or as soon as you are aware of a problem so that we can cancel or reschedule the appointment.   12. You MAY TAKE TYLENOL (acetaminophen) for pain as it is not a blood thinner.   13. PLEASE PLAN TO BE IN TOWN FOR TWO WEEKS FOLLOWING SURGERY, THIS IS IMPORTANT SO YOU CAN BE CHECKED FOR DRESSING CHANGES, SUTURE REMOVAL AND TO MONITOR FOR POSSIBLE COMPLICATIONS.  14.  

## 2020-06-23 ENCOUNTER — Encounter: Payer: Self-pay | Admitting: Emergency Medicine

## 2020-06-23 ENCOUNTER — Emergency Department
Admission: EM | Admit: 2020-06-23 | Discharge: 2020-06-23 | Disposition: A | Discharge status: Home / Self Care | Payer: Medicare Other | Attending: Emergency Medicine | Admitting: Emergency Medicine

## 2020-06-23 ENCOUNTER — Emergency Department: Payer: Medicare Other

## 2020-06-23 ENCOUNTER — Encounter: Payer: Self-pay | Admitting: Dermatology

## 2020-06-23 ENCOUNTER — Other Ambulatory Visit: Payer: Self-pay

## 2020-06-23 DIAGNOSIS — R079 Chest pain, unspecified: Secondary | ICD-10-CM | POA: Diagnosis not present

## 2020-06-23 DIAGNOSIS — K219 Gastro-esophageal reflux disease without esophagitis: Secondary | ICD-10-CM | POA: Diagnosis not present

## 2020-06-23 LAB — CBC WITH DIFFERENTIAL/PLATELET
Abs Immature Granulocytes: 0.02 10*3/uL (ref 0.00–0.07)
Basophils Absolute: 0.1 10*3/uL (ref 0.0–0.1)
Basophils Relative: 1 %
Eosinophils Absolute: 0.3 10*3/uL (ref 0.0–0.5)
Eosinophils Relative: 4 %
HCT: 42.4 % (ref 39.0–52.0)
Hemoglobin: 14.2 g/dL (ref 13.0–17.0)
Immature Granulocytes: 0 %
Lymphocytes Relative: 25 %
Lymphs Abs: 1.9 10*3/uL (ref 0.7–4.0)
MCH: 30.8 pg (ref 26.0–34.0)
MCHC: 33.5 g/dL (ref 30.0–36.0)
MCV: 92 fL (ref 80.0–100.0)
Monocytes Absolute: 0.6 10*3/uL (ref 0.1–1.0)
Monocytes Relative: 8 %
Neutro Abs: 4.7 10*3/uL (ref 1.7–7.7)
Neutrophils Relative %: 62 %
Platelets: 252 10*3/uL (ref 150–400)
RBC: 4.61 MIL/uL (ref 4.22–5.81)
RDW: 12.1 % (ref 11.5–15.5)
WBC: 7.7 10*3/uL (ref 4.0–10.5)
nRBC: 0 % (ref 0.0–0.2)

## 2020-06-23 LAB — COMPREHENSIVE METABOLIC PANEL
ALT: 21 U/L (ref 0–44)
AST: 21 U/L (ref 15–41)
Albumin: 4.1 g/dL (ref 3.5–5.0)
Alkaline Phosphatase: 74 U/L (ref 38–126)
Anion gap: 9 (ref 5–15)
BUN: 21 mg/dL (ref 8–23)
CO2: 29 mmol/L (ref 22–32)
Calcium: 9.2 mg/dL (ref 8.9–10.3)
Chloride: 104 mmol/L (ref 98–111)
Creatinine, Ser: 0.97 mg/dL (ref 0.61–1.24)
GFR, Estimated: >60 mL/min (ref >60)
Glucose, Bld: 99 mg/dL (ref 70–99)
Potassium: 3.8 mmol/L (ref 3.5–5.1)
Sodium: 142 mmol/L (ref 135–145)
Total Bilirubin: 0.6 mg/dL (ref 0.3–1.2)
Total Protein: 6.8 g/dL (ref 6.5–8.1)

## 2020-06-23 LAB — LIPASE, BLOOD: Lipase: 30 U/L (ref 11–51)

## 2020-06-23 LAB — TROPONIN I (HIGH SENSITIVITY): Troponin I (High Sensitivity): 2 ng/L (ref <18)

## 2020-06-23 MED ORDER — SUCRALFATE 1 G PO TABS: 1.0000 g | ORAL_TABLET | Freq: Four times a day (QID) | ORAL | 1 refills | Status: DC

## 2020-06-23 MED ORDER — ALUM & MAG HYDROXIDE-SIMETH 200-200-20 MG/5ML PO SUSP
30.0000 mL | Freq: Once | ORAL | Status: AC
  Administered 2020-06-23: 30 mL via ORAL
  Filled 2020-06-23: qty 30

## 2020-06-23 MED ORDER — OMEPRAZOLE 20 MG PO CPDR: 20.0000 mg | DELAYED_RELEASE_CAPSULE | Freq: Two times a day (BID) | ORAL | 1 refills | Status: DC

## 2020-06-23 MED ORDER — LIDOCAINE VISCOUS HCL 2 % MT SOLN
15.0000 mL | Freq: Once | OROMUCOSAL | Status: AC
  Administered 2020-06-23: 15 mL via ORAL
  Filled 2020-06-23: qty 15

## 2020-06-23 NOTE — ED Triage Notes (Signed)
Pt ambulatory to triage without difficulty or distress noted; Pt reports CP for "couple months"; st seen recently for such and told to f/u for endoscopy but c/o persistent CP, burning in throat and "burping" with diarrhea and constipation

## 2020-06-23 NOTE — Discharge Instructions (Signed)
Please follow-up with your gastroenterologist for an upper endoscopy as scheduled.

## 2020-06-23 NOTE — ED Provider Notes (Signed)
[facility] Emergency Department Provider Note   ____________________________________________   First MD Initiated Contact with Patient 06/23/20 0831     (approximate)  I have reviewed the triage vital signs and the nursing notes.   HISTORY  Chief Complaint Chest Pain    HPI Javier Archer is a 69 y.o. male with a stated past medical history of GERD who presents for continued midepigastric and lower chest pain.  Patient describes a burning pain that radiates up his chest into the back of his throat and has been present for the last 2 months.  Patient was recently seen and told that he needed to follow-up for an endoscopy given a history of hiatal hernia but patient presents for persistent and worsening chest pain.  Patient also endorses associated sore throat and frequent belching.  Patient also endorses associated alternating diarrhea and constipation over the last week.  Patient states that this chest pain/abdominal pain does worsen with any p.o. intake and has no exertional quality.         Past Medical History:  Diagnosis Date  . Hx of dysplastic nevus 05/24/2018    right low back spinal inf, moderate  . Hx of dysplastic nevus 05/24/2018   right low back 6.0 cm lat to spine, moderate  . Hx of dysplastic nevus 05/24/2018   right low back 7.5 cm lat to spine  . Seasonal allergies     There are no problems to display for this patient.   Past Surgical History:  Procedure Laterality Date  . CHOLECYSTECTOMY  2000  . HERNIA REPAIR  2016    Prior to Admission medications   Not on File    Allergies Patient has no known allergies.  No family history on file.  Social History Social History   Tobacco Use  . Smoking status: Never Smoker  . Smokeless tobacco: Never Used  Substance Use Topics  . Alcohol use: Yes  . Drug use: Never    Review of Systems Constitutional: No fever/chills Eyes: No visual changes. ENT: No sore  throat. Cardiovascular: Endorses chest pain. Respiratory: Denies shortness of breath. Gastrointestinal: No abdominal pain.  No nausea, no vomiting.  No diarrhea. Genitourinary: Negative for dysuria. Musculoskeletal: Negative for acute arthralgias Skin: Negative for rash. Neurological: Negative for headaches, weakness/numbness/paresthesias in any extremity Psychiatric: Negative for suicidal ideation/homicidal ideation   ____________________________________________   PHYSICAL EXAM:  VITAL SIGNS: ED Triage Vitals  Enc Vitals Group     BP 06/23/20 0634 (!) 146/75     Pulse Rate 06/23/20 0634 (!) 54     Resp 06/23/20 0634 18     Temp 06/23/20 0634 97.9 F (36.6 C)     Temp Source 06/23/20 0634 Oral     SpO2 06/23/20 0634 98 %     Weight 06/23/20 0626 195 lb (88.5 kg)     Height 06/23/20 0626 6\' 1"  (1.854 m)     Head Circumference --      Peak Flow --      Pain Score 06/23/20 0626 6     Pain Loc --      Pain Edu? --      Excl. in [place]? --    Constitutional: Alert and oriented. Well appearing and in no acute distress. Eyes: Conjunctivae are normal. PERRL. Head: Atraumatic. Nose: No congestion/rhinnorhea. Mouth/Throat: Mucous membranes are moist. Neck: No stridor Cardiovascular: Grossly normal heart sounds.  Good peripheral circulation. Respiratory: Normal respiratory effort.  No retractions. Gastrointestinal: Soft and nontender.  No distention. Musculoskeletal: No obvious deformities Neurologic:  Normal speech and language. No gross focal neurologic deficits are appreciated. Skin:  Skin is warm and dry. No rash noted. Psychiatric: Mood and affect are normal. Speech and behavior are normal.  ____________________________________________   LABS (all labs ordered are listed, but only abnormal results are displayed)  Labs Reviewed  CBC WITH DIFFERENTIAL/PLATELET  COMPREHENSIVE METABOLIC PANEL  LIPASE, BLOOD  TROPONIN I (HIGH SENSITIVITY)  TROPONIN I (HIGH SENSITIVITY)    ____________________________________________  EKG  ED ECG REPORT I, Naaman Plummer, the attending physician, personally viewed and interpreted this ECG.  Date: 06/23/2020 EKG Time: 0626 Rate: 57 Rhythm: normal sinus rhythm QRS Axis: normal Intervals: normal ST/T Wave abnormalities: normal Narrative Interpretation: no evidence of acute ischemia  ____________________________________________  RADIOLOGY  ED MD interpretation: 2 view x-ray of the chest shows no evidence of acute abnormalities including no pneumonia, pneumothorax, or widened mediastinum  Official radiology report(s): DG Chest 2 View  Result Date: 06/23/2020 CLINICAL DATA:  Chest pain. EXAM: CHEST - 2 VIEW COMPARISON:  06/11/2020. FINDINGS: Mediastinum and hilar structures normal. Heart size normal. No focal infiltrate. No pleural effusion or pneumothorax. Degenerative change thoracic spine. Surgical clips right upper quadrant. IMPRESSION: No acute cardiopulmonary disease. Electronically Signed   By: Marcello Moores  Register   On: 06/23/2020 06:58    ____________________________________________   PROCEDURES  Procedure(s) performed (including Critical Care):  .1-3 Lead EKG Interpretation Performed by: Naaman Plummer, MD Authorized by: Naaman Plummer, MD     Interpretation: normal     ECG rate:  82   ECG rate assessment: normal     Rhythm: sinus rhythm     Ectopy: none     Conduction: normal       ____________________________________________   INITIAL IMPRESSION / ASSESSMENT AND PLAN / ED COURSE  As part of my medical decision making, I reviewed the following data within the [place] notes reviewed and incorporated, Labs reviewed, EKG interpreted, Old chart reviewed, Radiograph reviewed and Notes from prior ED visits reviewed and incorporated        Workup: ECG, CXR, CBC, BMP, Troponin Findings: ECG: No overt evidence of STEMI. No evidence of Brugadas sign, delta wave,  epsilon wave, significantly prolonged QTc, or malignant arrhythmia HS Troponin: Negative x1 Other Labs unremarkable for emergent problems. CXR: Without PTX, PNA, or widened mediastinum Last Stress Test: Never Last Heart Catheterization: Never HEART Score: 2  Given History, Exam, and Workup I have low suspicion for ACS, Pneumothorax, Pneumonia, Pulmonary Embolus, Tamponade, Aortic Dissection or other emergent problem as a cause for this presentation.   Reassesment: Prior to discharge patients pain was controlled and they were well appearing.  Disposition:  Discharge. Strict return precautions discussed with patient with full understanding. Advised patient to follow up promptly with primary care provider      ____________________________________________   FINAL CLINICAL IMPRESSION(S) / ED DIAGNOSES  Final diagnoses:  Chest pain, unspecified type  Gastroesophageal reflux disease, unspecified whether esophagitis present     ED Discharge Orders    None       Note:  This document was prepared using Dragon voice recognition software and may include unintentional dictation errors.   Naaman Plummer, MD 06/23/20 [phone]

## 2020-08-05 ENCOUNTER — Encounter: Payer: Self-pay | Admitting: Gastroenterology

## 2020-08-05 ENCOUNTER — Other Ambulatory Visit: Payer: Self-pay

## 2020-08-05 ENCOUNTER — Ambulatory Visit (INDEPENDENT_AMBULATORY_CARE_PROVIDER_SITE_OTHER): Payer: Medicare Other | Admitting: Gastroenterology

## 2020-08-05 VITALS — BP 126/73 | HR 61 | Temp 97.5°F | Ht 73.0 in | Wt 205.6 lb

## 2020-08-05 DIAGNOSIS — L720 Epidermal cyst: Secondary | ICD-10-CM | POA: Insufficient documentation

## 2020-08-05 DIAGNOSIS — M62830 Muscle spasm of back: Secondary | ICD-10-CM | POA: Insufficient documentation

## 2020-08-05 DIAGNOSIS — D229 Melanocytic nevi, unspecified: Secondary | ICD-10-CM | POA: Insufficient documentation

## 2020-08-05 DIAGNOSIS — K219 Gastro-esophageal reflux disease without esophagitis: Secondary | ICD-10-CM | POA: Diagnosis not present

## 2020-08-05 DIAGNOSIS — R1030 Lower abdominal pain, unspecified: Secondary | ICD-10-CM | POA: Insufficient documentation

## 2020-08-05 DIAGNOSIS — M7540 Impingement syndrome of unspecified shoulder: Secondary | ICD-10-CM | POA: Insufficient documentation

## 2020-08-05 NOTE — Progress Notes (Signed)
Gastroenterology Consultation  Referring Provider:     [facility] Primary Care Physician:  [facility] Gastro[facility]ologist:  Dr. Allen Norris     Reason for Consultation:     Atypical chest pain        HPI:   Javier Archer is a 70 y.o. y/o male referred for consultation & management of Atypical chest pain by Dr. Thera Flake [facility]  This patient comes in to see me today after being seen in emergency room back at the end of November 2021.  At that time the patient had come in for chest pain that was located in the chest and the epigastric area.  The patient had reportedly pain to be worse with by mouth intake and also had reported that he was recommended to have an upper endoscopy prior to that due to a history of a hiatal hernia and reflux symptoms.  The patient had reported in the ER that he was also having alternating diarrhea and constipation for a week prior to coming to the emergency room.  The patient's cardiac workup was negative and he was recommended to come see a GI for his symptoms.  He was also started on omeprazole and sucralfate.  Associated symptoms were also reported to be burning in his throat. The patient reports that he had his last upper endoscopy and colonoscopy in [facility] and had been told that he needs another colonoscopy sometime this year.  The patient symptoms have completely resolved and he is no longer having any chest pain.  He also reports that he has not had any dysphagia or unexplained weight loss.  He did take the Carafate and states that it upset his stomach and he has also stopped the Prilosec since him is symptoms improved.  Past Medical History:  Diagnosis Date  . Hx of dysplastic nevus 05/24/2018    right low back spinal inf, moderate  . Hx of dysplastic nevus 05/24/2018   right low back 6.0 cm lat to spine, moderate  . Hx of dysplastic nevus 05/24/2018   right low back 7.5 cm lat to  spine  . Seasonal allergies     Past Surgical History:  Procedure Laterality Date  . CHOLECYSTECTOMY  2000  . HERNIA REPAIR  2016    Prior to Admission medications   Medication Sig Start Date End Date Taking? Authorizing Provider  omeprazole (PRILOSEC) 20 MG capsule Take 1 capsule (20 mg total) by mouth 2 (two) times daily. 06/23/20 06/23/21  Naaman Plummer, MD  sucralfate (CARAFATE) 1 g tablet Take 1 tablet (1 g total) by mouth 4 (four) times daily. 06/23/20 06/23/21  Naaman Plummer, MD    History reviewed. No pertinent family history.   Social History   Tobacco Use  . Smoking status: Never Smoker  . Smokeless tobacco: Never Used  Substance Use Topics  . Alcohol use: Yes  . Drug use: Never    Allergies as of 08/05/2020  . (No Known Allergies)    Review of Systems:    All systems reviewed and negative except where noted in HPI.   Physical Exam:  BP 126/73   Pulse 61   Temp (!) 97.5 F (36.4 C) (Temporal)   Ht 6\' 1"  (1.854 m)   Wt 205 lb 9.6 oz (93.3 kg)   BMI 27.13 kg/m  No LMP for male patient. General:   Alert,  Well-developed, well-nourished, pleasant and cooperative in NAD  Head:  Normocephalic and atraumatic. Eyes:  Sclera clear, no icterus.   Conjunctiva pink. Ears:  Normal auditory acuity. Neck:  Supple; no masses or thyromegaly. Lungs:  Respirations even and unlabored.  Clear throughout to auscultation.   No wheezes, crackles, or rhonchi. No acute distress. Heart:  Regular rate and rhythm; no murmurs, clicks, rubs, or gallops. Abdomen:  Normal bowel sounds.  No bruits.  Soft, non-tender and non-distended without masses, hepatosplenomegaly or hernias noted.  No guarding or rebound tenderness.  Negative Carnett sign.   Rectal:  Deferred.  Pulses:  Normal pulses noted. Extremities:  No clubbing or edema.  No cyanosis. Neurologic:  Alert and oriented x3;  grossly normal neurologically. Skin:  Intact without significant lesions or rashes.  No  jaundice. Lymph Nodes:  No significant cervical adenopathy. Psych:  Alert and cooperative. Normal mood and affect.  Imaging Studies: No results found.  Assessment and Plan:   Javier Archer is a 70 y.o. y/o male who comes in today with a visit to the ER for what sounds like GERD.  The patient was treated with Carafate and Prilosec and states that his symptoms have completely resolved and he has come off the medication without any return of his symptoms.  The patient states he has a history of a hiatal hernia and is due for colonoscopy due to a personal history of colon polyps sometime this year.  The patient will check when he is due for the colonoscopy and at that time he will be set up for an EGD at the same time.  He has been told to contact me if his symptoms return.  The patient has been explained the plan agrees with the    Lucilla Lame, MD. Marval Regal    Note: This dictation was prepared with Dragon dictation along with smaller phrase technology. Any transcriptional errors that result from this process are unintentional.

## 2020-09-01 ENCOUNTER — Other Ambulatory Visit: Payer: Self-pay | Admitting: Dermatology

## 2020-09-01 ENCOUNTER — Telehealth: Payer: Self-pay

## 2020-09-01 ENCOUNTER — Ambulatory Visit (INDEPENDENT_AMBULATORY_CARE_PROVIDER_SITE_OTHER): Payer: Medicare Other | Admitting: Dermatology

## 2020-09-01 ENCOUNTER — Other Ambulatory Visit: Payer: Self-pay

## 2020-09-01 DIAGNOSIS — L720 Epidermal cyst: Secondary | ICD-10-CM

## 2020-09-01 MED ORDER — MUPIROCIN 2 % EX OINT: 1.0000 "application " | TOPICAL_OINTMENT | Freq: Every day | CUTANEOUS | 0 refills | Status: DC

## 2020-09-01 NOTE — Progress Notes (Signed)
   Follow-Up Visit   Subjective  Javier Archer is a 70 y.o. male who presents for the following: Symptomatic growing Cyst (Vs other of upper back spinal - Excise today).  The following portions of the chart were reviewed this encounter and updated as appropriate:   Tobacco  Allergies  Meds  Problems  Med Hx  Surg Hx  Fam Hx     Review of Systems:  No other skin or systemic complaints except as noted in HPI or Assessment and Plan.  Objective  Well appearing patient in no apparent distress; mood and affect are within normal limits.  A focused examination was performed including back. Relevant physical exam findings are noted in the Assessment and Plan.  Objective  Upper back spinal: Subcutaneous nodule.    Assessment & Plan  Epidermal inclusion cyst Upper back spinal  Skin excision - Upper back spinal  Lesion length (cm):  3 Lesion width (cm):  1.5 Margin per side (cm):  0.1 Total excision diameter (cm):  3.2 Informed consent: discussed and consent obtained   Timeout: patient name, date of birth, surgical site, and procedure verified   Procedure prep:  Patient was prepped and draped in usual sterile fashion Prep type:  Isopropyl alcohol and povidone-iodine Anesthesia: the lesion was anesthetized in a standard fashion   Anesthetic:  1% lidocaine w/ epinephrine 1-100,000 buffered w/ 8.4% NaHCO3 Instrument used: #15 blade   Hemostasis achieved with: pressure   Hemostasis achieved with comment:  Electrocautery Outcome: patient tolerated procedure well with no complications   Post-procedure details: sterile dressing applied and wound care instructions given   Dressing type: bandage and pressure dressing (mupirocin)    Skin repair - Upper back spinal Complexity:  Complex Final length (cm):  4 Reason for type of repair: reduce tension to allow closure, reduce the risk of dehiscence, infection, and necrosis, reduce subcutaneous dead space and avoid a hematoma, allow  closure of the large defect, preserve normal anatomy, preserve normal anatomical and functional relationships and enhance both functionality and cosmetic results   Undermining: area extensively undermined   Undermining comment:  Undermining defect 2.0 cm Subcutaneous layers (deep stitches):  Suture size:  2-0 Suture type: Vicryl (polyglactin 910)   Subcutaneous suture technique: inverted dermal. Fine/surface layer approximation (top stitches):  Suture size:  3-0 Suture type: nylon   Stitches: simple running   Suture removal (days):  7 Hemostasis achieved with: suture and pressure Outcome: patient tolerated procedure well with no complications   Post-procedure details: sterile dressing applied and wound care instructions given   Dressing type: bandage and pressure dressing (mupirocin)    mupirocin ointment (BACTROBAN) 2 % - Upper back spinal  Specimen 1 - Surgical pathology Differential Diagnosis: Cyst vs other  Check Margins: No Subcutaneous nodule.  Return in about 1 week (around 09/08/2020) for suture removal.  I, Ashok Cordia, CMA, am acting as scribe for Sarina Ser, MD .  Documentation: I have reviewed the above documentation for accuracy and completeness, and I agree with the above.  Sarina Ser, MD

## 2020-09-01 NOTE — Telephone Encounter (Signed)
Left message for patient regarding today's surgery/hd

## 2020-09-01 NOTE — Patient Instructions (Signed)

## 2020-09-05 ENCOUNTER — Encounter: Payer: Self-pay | Admitting: Dermatology

## 2020-09-08 ENCOUNTER — Encounter: Payer: Self-pay | Admitting: Dermatology

## 2020-09-08 ENCOUNTER — Ambulatory Visit (INDEPENDENT_AMBULATORY_CARE_PROVIDER_SITE_OTHER): Payer: Medicare Other | Admitting: Dermatology

## 2020-09-08 ENCOUNTER — Other Ambulatory Visit: Payer: Self-pay

## 2020-09-08 DIAGNOSIS — Z4802 Encounter for removal of sutures: Secondary | ICD-10-CM

## 2020-09-08 DIAGNOSIS — L720 Epidermal cyst: Secondary | ICD-10-CM

## 2020-09-08 DIAGNOSIS — L659 Nonscarring hair loss, unspecified: Secondary | ICD-10-CM

## 2020-09-08 NOTE — Progress Notes (Signed)
   Follow-Up Visit   Subjective  Javier Archer is a 70 y.o. male who presents for the following: post op./suture removal (Pathology proven benign cyst of the upper back spinal - patient is here today for suture removal. He also has a lesion on his post scalp that his wife noticed, and he would like checked).  The following portions of the chart were reviewed this encounter and updated as appropriate:   Tobacco  Allergies  Meds  Problems  Med Hx  Surg Hx  Fam Hx     Review of Systems:  No other skin or systemic complaints except as noted in HPI or Assessment and Plan.  Objective  Well appearing patient in no apparent distress; mood and affect are within normal limits.  A focused examination was performed including the scalp and back. Relevant physical exam findings are noted in the Assessment and Plan.  Objective  Upper back spinal: Healing excision site  Objective  Post scalp: Area of hair loss with firm SQ nodule   Assessment & Plan  Epidermal inclusion cyst Upper back spinal  Encounter for Removal of Sutures - Incision site at the upper back spinal is clean, dry and intact - Wound cleansed, sutures removed, wound cleansed and steri strips applied.  - Discussed pathology results showing a benign cyst  - Patient advised to keep steri-strips dry until they fall off. - Scars remodel for a full year. - Once steri-strips fall off, patient can apply over-the-counter silicone scar cream each night to help with scar remodeling if desired. - Patient advised to call with any concerns or if they notice any new or changing lesions.   Other Related Medications mupirocin ointment (BACTROBAN) 2 %  Alopecia and scar Post scalp At site of probable ruptured cyst - appears a small cyst may still be present. --due to small size do not recommend excision at this time. RTC should lesion become bothersome.  Return in about 9 months (around 06/08/2021) for TBSE.  Luther Redo, CMA,  am acting as scribe for Sarina Ser, MD .  Documentation: I have reviewed the above documentation for accuracy and completeness, and I agree with the above.  Sarina Ser, MD

## 2021-03-08 ENCOUNTER — Other Ambulatory Visit: Payer: Self-pay

## 2021-03-08 ENCOUNTER — Ambulatory Visit (INDEPENDENT_AMBULATORY_CARE_PROVIDER_SITE_OTHER): Payer: Medicare Other | Admitting: Dermatology

## 2021-03-08 DIAGNOSIS — D485 Neoplasm of uncertain behavior of skin: Secondary | ICD-10-CM | POA: Diagnosis not present

## 2021-03-08 DIAGNOSIS — L578 Other skin changes due to chronic exposure to nonionizing radiation: Secondary | ICD-10-CM

## 2021-03-08 DIAGNOSIS — L820 Inflamed seborrheic keratosis: Secondary | ICD-10-CM

## 2021-03-08 DIAGNOSIS — L570 Actinic keratosis: Secondary | ICD-10-CM | POA: Diagnosis not present

## 2021-03-08 NOTE — Progress Notes (Deleted)
Follow-Up Visit   Subjective  Javier Archer is a 70 y.o. male who presents for the following: Other (Spot on post scalp that got really sore for a while but is not sore now. Also has a spot on his left ear that he would like checked).    The following portions of the chart were reviewed this encounter and updated as appropriate:       Review of Systems:  No other skin or systemic complaints except as noted in HPI or Assessment and Plan.  Objective  Well appearing patient in no apparent distress; mood and affect are within normal limits.  A focused examination was performed including face, scalp. Relevant physical exam findings are noted in the Assessment and Plan.  Left ear between sup helix and antihelix Erythematous thin papules/macules with gritty scale.   Left sup sternum Erythematous keratotic or waxy stuck-on papule or plaque.     Assessment & Plan  Neoplasm of uncertain behavior of skin Left post scalp  Ruptured cyst vs other  - will plan excision.  AK (actinic keratosis) Left ear between sup helix and antihelix  Recheck on follow up. May plan biopsy if persistent.  Destruction of lesion - Left ear between sup helix and antihelix Complexity: simple   Destruction method: cryotherapy   Informed consent: discussed and consent obtained   Timeout:  patient name, date of birth, surgical site, and procedure verified Lesion destroyed using liquid nitrogen: Yes   Region frozen until ice ball extended beyond lesion: Yes   Outcome: patient tolerated procedure well with no complications   Post-procedure details: wound care instructions given    Inflamed seborrheic keratosis Left sup sternum  Recheck on follow up  Destruction of lesion - Left sup sternum Complexity: simple   Destruction method: cryotherapy   Informed consent: discussed and consent obtained   Timeout:  patient name, date of birth, surgical site, and procedure verified Lesion destroyed using  liquid nitrogen: Yes   Region frozen until ice ball extended beyond lesion: Yes   Outcome: patient tolerated procedure well with no complications   Post-procedure details: wound care instructions given       Follow-Up Visit   Subjective  Javier Archer is a 70 y.o. male who presents for the following: Other (Spot on post scalp that got really sore for a while but is not sore now. Also has a spot on his left ear that he would like checked).  ***  The following portions of the chart were reviewed this encounter and updated as appropriate:       Review of Systems:  No other skin or systemic complaints except as noted in HPI or Assessment and Plan.  Objective  Well appearing patient in no apparent distress; mood and affect are within normal limits.  [id number] full examination was performed including scalp, head, eyes, ears, nose, lips, neck, chest, axillae, abdomen, back, buttocks, bilateral upper extremities, bilateral lower extremities, hands, feet, fingers, toes, fingernails, and toenails. All findings within normal limits unless otherwise noted below."}  Left ear between sup helix and antihelix Erythematous thin papules/macules with gritty scale.   Left sup sternum Erythematous keratotic or waxy stuck-on papule or plaque.     Assessment & Plan  Neoplasm of uncertain behavior of skin Left post scalp  Ruptured cyst vs other  - will plan excision.  AK (actinic keratosis) Left ear between sup helix and antihelix  Recheck on follow up. May plan biopsy if persistent.  Destruction of  lesion - Left ear between sup helix and antihelix Complexity: simple   Destruction method: cryotherapy   Informed consent: discussed and consent obtained   Timeout:  patient name, date of birth, surgical site, and procedure verified Lesion destroyed using liquid nitrogen: Yes   Region frozen until ice ball extended beyond lesion: Yes   Outcome: patient tolerated procedure well with no  complications   Post-procedure details: wound care instructions given    Inflamed seborrheic keratosis Left sup sternum  Recheck on follow up  Destruction of lesion - Left sup sternum Complexity: simple   Destruction method: cryotherapy   Informed consent: discussed and consent obtained   Timeout:  patient name, date of birth, surgical site, and procedure verified Lesion destroyed using liquid nitrogen: Yes   Region frozen until ice ball extended beyond lesion: Yes   Outcome: patient tolerated procedure well with no complications   Post-procedure details: wound care instructions given     Return for Surgery - cyst of left post scalp, AK and ISK follow up.

## 2021-03-08 NOTE — Progress Notes (Signed)
   Follow-Up Visit   Subjective  Javier Archer is a 70 y.o. male who presents for the following: Other (Spot on post scalp that got really sore for a while but is not sore now. Also has a spot on his left ear that he would like checked).  The following portions of the chart were reviewed this encounter and updated as appropriate:   Tobacco  Allergies  Meds  Problems  Med Hx  Surg Hx  Fam Hx     Review of Systems:  No other skin or systemic complaints except as noted in HPI or Assessment and Plan.  Objective  Well appearing patient in no apparent distress; mood and affect are within normal limits.  A focused examination was performed including face, scalp. Relevant physical exam findings are noted in the Assessment and Plan.  Left ear between sup helix and antihelix Erythematous thin papules/macules with gritty scale.   Left sup sternum Erythematous keratotic or waxy stuck-on papule or plaque.    Assessment & Plan   Actinic Damage - chronic, secondary to cumulative UV radiation exposure/sun exposure over time - diffuse scaly erythematous macules with underlying dyspigmentation - Recommend daily broad spectrum sunscreen SPF 30+ to sun-exposed areas, reapply every 2 hours as needed.  - Recommend staying in the shade or wearing long sleeves, sun glasses (UVA+UVB protection) and wide brim hats (4-inch brim around the entire circumference of the hat). - Call for new or changing lesions.  Neoplasm of uncertain behavior of skin Left post scalp Ruptured cyst with scar vs other  - will plan excision.  AK (actinic keratosis) Left ear between sup helix and antihelix Recheck on follow up. May plan biopsy if persistent.  Destruction of lesion - Left ear between sup helix and antihelix Complexity: simple   Destruction method: cryotherapy   Informed consent: discussed and consent obtained   Timeout:  patient name, date of birth, surgical site, and procedure verified Lesion  destroyed using liquid nitrogen: Yes   Region frozen until ice ball extended beyond lesion: Yes   Outcome: patient tolerated procedure well with no complications   Post-procedure details: wound care instructions given    Inflamed seborrheic keratosis Left sup sternum Recheck on follow up  Destruction of lesion - Left sup sternum Complexity: simple   Destruction method: cryotherapy   Informed consent: discussed and consent obtained   Timeout:  patient name, date of birth, surgical site, and procedure verified Lesion destroyed using liquid nitrogen: Yes   Region frozen until ice ball extended beyond lesion: Yes   Outcome: patient tolerated procedure well with no complications   Post-procedure details: wound care instructions given    Return for Surgery - cyst of left post scalp, AK and ISK follow up.  I, Ashok Cordia, CMA, am acting as scribe for Sarina Ser, MD . Documentation: I have reviewed the above documentation for accuracy and completeness, and I agree with the above.  Sarina Ser, MD

## 2021-03-08 NOTE — Patient Instructions (Signed)
OSRIC COUPLAND [place] [id number]   You have an upcoming surgery appointment scheduled at [facility]. '@FUTURESURGAPPT'$ @  PRE-OPERATIVE INSTRUCTIONS  We recommend you read the following instructions. If you have any questions or concerns, please call the office at [phone].  Shower and wash the entire body with soap and water the day of your surgery paying special attention to cleansing at and around the planned surgery site. Avoid aspirin or aspirin containing products at least ten (10) days prior to your surgical procedure and for at least one week (7 days) after your surgical procedure. If you take aspirin on a regular basis for heart disease or history of stroke or for any other reason, we may recommend you continue taking aspirin but please notify us if you take this on a regular basis. Aspirin can cause more bleeding to occur during surgery as well as prolonged bleeding and bruising after surgery. Avoid other nonsteroidal pain medications at least one week prior to surgery and at least one week after your surgery. These include medications such as Ibuprofen (Motrin, Advil and Nuprin), Naprosyn, Voltaren, Relafen, etc. If these medications are used for therapeutic reasons, please inform us as they can cause increased bleeding or prolonged bleeding during and bruising after surgical procedures.  Please advise [place] if you are taking any "blood thinner" medications such as Coumadin or Dipyridamole or Plavix or similar medications. These cause increased bleeding and prolonged bleeding during procedures and bruising after surgical procedures. We may have to consider discontinuing these medications briefly prior to and shortly after your surgery if safe to do so. Please inform us of all medications you are currently taking. All medications that are taken regularly should be taken the day of surgery as you always do. Nevertheless, we need to be informed of what  medications you are taking prior to surgery to know whether they will affect the procedure or cause any complications. Please inform us of any medication allergies. Also inform us of whether you have allergies to Latex or rubber products or whether you have had any adverse reaction to Lidocaine or Epinephrine. Please inform us of any prosthetic or artificial body parts such as artificial heart valve, joint replacements, etc., or similar condition that might require preoperative antibiotics. We recommend avoidance of alcohol at least two weeks prior to surgery and continued avoidance for at least two weeks after surgery. We recommend avoidance of tobacco smoking at least two weeks prior to surgery and continued abstinence for at least two weeks after surgery. Do not plan strenuous exercise, strenuous work or strenuous lifting for approximately four weeks after your surgery. We request if you are unable to make your scheduled surgical appointment, please call us at least a week in advance or as soon as you are aware of a problem so that we can cancel or reschedule the appointment. You MAY TAKE TYLENOL (acetaminophen) for pain as it is not a blood thinner. PLEASE PLAN TO BE IN TOWN FOR TWO WEEKS FOLLOWING SURGERY. THIS IS IMPORTANT SO YOU CAN BE CHECKED FOR DRESSING CHANGES, SUTURE REMOVAL AND TO MONITOR FOR POSSIBLE COMPLICATIONS.  Cryotherapy Aftercare  Wash gently with soap and water everyday.   Apply Vaseline and Band-Aid daily until healed.    If you have any questions or concerns for your doctor, please call our main line at [phone] and press option 4 to reach your doctor's medical assistant. If no one answers, please leave a voicemail as directed and we will  return your call as soon as possible. Messages left after 4 pm will be answered the following business day.   You may also send [place] a message via [place]. We typically respond to MyChart messages within 1-2 business days.  For  prescription refills, please ask your pharmacy to contact our office. Our fax number is [phone].  If you have an urgent issue when the clinic is closed that cannot wait until the next business day, you can page your doctor at the number below.    Please note that while we do our best to be available for urgent issues outside of office hours, we are not available 24/7.   If you have an urgent issue and are unable to reach [place], you may choose to seek medical care at your doctor's office, retail clinic, urgent care center, or emergency room.  If you have a medical emergency, please immediately call 911 or go to the emergency department.  Pager Numbers  - Dr. Nehemiah Massed: [phone]  - Dr. Laurence Ferrari: [phone]  - Dr. Nicole Kindred: [phone]  In the event of inclement weather, please call our main line at [phone] for an update on the status of any delays or closures.  Dermatology Medication Tips: Please keep the boxes that topical medications come in in order to help keep track of the instructions about where and how to use these. Pharmacies typically print the medication instructions only on the boxes and not directly on the medication tubes.   If your medication is too expensive, please contact our office at [phone] option 4 or send [place] a message through [place].   We are unable to tell what your co-pay for medications will be in advance as this is different depending on your insurance coverage. However, we may be able to find a substitute medication at lower cost or fill out paperwork to get insurance to cover a needed medication.   If a prior authorization is required to get your medication covered by your insurance company, please allow [place] 1-2 business days to complete this process.  Drug prices often vary depending on where the prescription is filled and some pharmacies may offer cheaper prices.  The website www.goodrx.com contains coupons for medications through different  pharmacies. The prices here do not account for what the cost may be with help from insurance (it may be cheaper with your insurance), but the website can give you the price if you did not use any insurance.  - You can print the associated coupon and take it with your prescription to the pharmacy.  - You may also stop by our office during regular business hours and pick up a GoodRx coupon card.  - If you need your prescription sent electronically to a different pharmacy, notify our office through [facility] or by phone at [phone] option 4.

## 2021-03-10 ENCOUNTER — Encounter: Payer: Self-pay | Admitting: Dermatology

## 2021-04-13 ENCOUNTER — Ambulatory Visit (INDEPENDENT_AMBULATORY_CARE_PROVIDER_SITE_OTHER): Payer: Medicare Other | Admitting: Dermatology

## 2021-04-13 ENCOUNTER — Other Ambulatory Visit: Payer: Self-pay

## 2021-04-13 DIAGNOSIS — L720 Epidermal cyst: Secondary | ICD-10-CM | POA: Diagnosis not present

## 2021-04-13 DIAGNOSIS — D485 Neoplasm of uncertain behavior of skin: Secondary | ICD-10-CM

## 2021-04-13 MED ORDER — MUPIROCIN 2 % EX OINT: 1.0000 "application " | TOPICAL_OINTMENT | Freq: Every day | CUTANEOUS | 0 refills | Status: DC

## 2021-04-13 NOTE — Patient Instructions (Signed)

## 2021-04-13 NOTE — Progress Notes (Signed)
   Follow-Up Visit   Subjective  Javier Archer is a 70 y.o. male who presents for the following: Cyst (Patient is here today for excision of the L post scalp cyst/).  The following portions of the chart were reviewed this encounter and updated as appropriate:   Tobacco  Allergies  Meds  Problems  Med Hx  Surg Hx  Fam Hx     Review of Systems:  No other skin or systemic complaints except as noted in HPI or Assessment and Plan.  Objective  Well appearing patient in no apparent distress; mood and affect are within normal limits.  A focused examination was performed including the scalp. Relevant physical exam findings are noted in the Assessment and Plan.  L post scalp 1.6 firm SQ nodule   Assessment & Plan  Neoplasm of uncertain behavior of skin L post scalp  Skin excision  Lesion length (cm):  1.6 Lesion width (cm):  1.6 Margin per side (cm):  0 Total excision diameter (cm):  1.6 Informed consent: discussed and consent obtained   Timeout: patient name, date of birth, surgical site, and procedure verified   Procedure prep:  Patient was prepped and draped in usual sterile fashion Prep type:  Isopropyl alcohol and povidone-iodine Anesthesia: the lesion was anesthetized in a standard fashion   Anesthetic:  1% lidocaine w/ epinephrine 1-100,000 buffered w/ 8.4% NaHCO3 Hemostasis achieved with: pressure   Hemostasis achieved with comment:  Electrocautery Outcome: patient tolerated procedure well with no complications   Post-procedure details: sterile dressing applied and wound care instructions given   Dressing type: bandage and pressure dressing    Skin repair Complexity:  Complex Final length (cm):  1.6 Informed consent: discussed and consent obtained   Timeout: patient name, date of birth, surgical site, and procedure verified   Procedure prep:  Patient was prepped and draped in usual sterile fashion Prep type:  Povidone-iodine Anesthesia: the lesion was  anesthetized in a standard fashion   Anesthesia comment:  3.0 cc lidocaine with epi Reason for type of repair: reduce tension to allow closure, reduce the risk of dehiscence, infection, and necrosis, reduce subcutaneous dead space and avoid a hematoma, allow closure of the large defect, preserve normal anatomy, preserve normal anatomical and functional relationships and enhance both functionality and cosmetic results   Subcutaneous layers (deep stitches):  Suture size:  3-0 Suture type: Vicryl (polyglactin 910)   Fine/surface layer approximation (top stitches):  Suture size:  4-0 Suture type: nylon   Stitches: simple running   Suture removal (days):  7 Hemostasis achieved with: suture and pressure Outcome: patient tolerated procedure well with no complications   Post-procedure details: sterile dressing applied and wound care instructions given   Dressing type: bandage and pressure dressing   Additional details:  Undermining 1.6 cm   Specimen 1 - Surgical pathology Differential Diagnosis: Cyst vs other Check Margins: No  Start Mupirocin 2% oint to aa QD.   Epidermal inclusion cyst  Related Medications mupirocin ointment (BACTROBAN) 2 % Apply 1 application topically daily. With dressing changes  Return in about 1 week (around 04/20/2021) for suture removal.  I, Rudell Cobb, CMA, am acting as scribe for Sarina Ser, MD . Documentation: I have reviewed the above documentation for accuracy and completeness, and I agree with the above.  Sarina Ser, MD

## 2021-04-15 ENCOUNTER — Encounter: Payer: Self-pay | Admitting: Dermatology

## 2021-04-20 ENCOUNTER — Other Ambulatory Visit: Payer: Self-pay

## 2021-04-20 ENCOUNTER — Ambulatory Visit (INDEPENDENT_AMBULATORY_CARE_PROVIDER_SITE_OTHER): Payer: Medicare Other | Admitting: Dermatology

## 2021-04-20 DIAGNOSIS — L578 Other skin changes due to chronic exposure to nonionizing radiation: Secondary | ICD-10-CM

## 2021-04-20 DIAGNOSIS — L720 Epidermal cyst: Secondary | ICD-10-CM

## 2021-04-20 DIAGNOSIS — L820 Inflamed seborrheic keratosis: Secondary | ICD-10-CM | POA: Diagnosis not present

## 2021-04-20 DIAGNOSIS — L570 Actinic keratosis: Secondary | ICD-10-CM

## 2021-04-20 NOTE — Patient Instructions (Signed)

## 2021-04-20 NOTE — Progress Notes (Signed)
Follow-Up Visit   Subjective  Javier Archer is a 70 y.o. male who presents for the following: post op./suture removal (Of the L post scalp - pathology proven cyst, patient is here today for suture removal) and Actinic Keratosis (Recheck the ears and face for new/persistent skin lesions).  The following portions of the chart were reviewed this encounter and updated as appropriate:   Tobacco  Allergies  Meds  Problems  Med Hx  Surg Hx  Fam Hx     Review of Systems:  No other skin or systemic complaints except as noted in HPI or Assessment and Plan.  Objective  Well appearing patient in no apparent distress; mood and affect are within normal limits.  A focused examination was performed including the scalp and ears. Relevant physical exam findings are noted in the Assessment and Plan.  L ear x 1, R ear x 1, scalp x 2 (4) Erythematous thin papules/macules with gritty scale.   Scalp x 2 (2) Erythematous keratotic or waxy stuck-on papule or plaque.   L post scalp Healing excision site.   Assessment & Plan  AK (actinic keratosis) (4) L ear x 1, R ear x 1, scalp x 2  Destruction of lesion - L ear x 1, R ear x 1, scalp x 2 Complexity: simple   Destruction method: cryotherapy   Informed consent: discussed and consent obtained   Timeout:  patient name, date of birth, surgical site, and procedure verified Lesion destroyed using liquid nitrogen: Yes   Region frozen until ice ball extended beyond lesion: Yes   Outcome: patient tolerated procedure well with no complications   Post-procedure details: wound care instructions given    Inflamed seborrheic keratosis Scalp x 2  Destruction of lesion - Scalp x 2 Complexity: simple   Destruction method: cryotherapy   Informed consent: discussed and consent obtained   Timeout:  patient name, date of birth, surgical site, and procedure verified Lesion destroyed using liquid nitrogen: Yes   Region frozen until ice ball extended beyond  lesion: Yes   Outcome: patient tolerated procedure well with no complications   Post-procedure details: wound care instructions given    Epidermal inclusion cyst L post scalp  Pathology proven benign cyst -   Encounter for Removal of Sutures - Incision site at the L post scalp is clean, dry and intact - Wound cleansed, sutures removed, wound cleansed and steri strips applied.  - Discussed pathology results showing a benign cyst.  - Patient advised to keep steri-strips dry until they fall off. - Scars remodel for a full year. - Once steri-strips fall off, patient can apply over-the-counter silicone scar cream each night to help with scar remodeling if desired. - Patient advised to call with any concerns or if they notice any new or changing lesions.  Related Medications mupirocin ointment (BACTROBAN) 2 % Apply 1 application topically daily. With dressing changes  Actinic Damage - chronic, secondary to cumulative UV radiation exposure/sun exposure over time - diffuse scaly erythematous macules with underlying dyspigmentation - Recommend daily broad spectrum sunscreen SPF 30+ to sun-exposed areas, reapply every 2 hours as needed.  - Recommend staying in the shade or wearing long sleeves, sun glasses (UVA+UVB protection) and wide brim hats (4-inch brim around the entire circumference of the hat). - Call for new or changing lesions.  Return for 6 mth TBSE - cancel Nov appointment.  Luther Redo, CMA, am acting as scribe for Sarina Ser, MD .  Documentation: I have  reviewed the above documentation for accuracy and completeness, and I agree with the above.  Sarina Ser, MD

## 2021-04-22 ENCOUNTER — Encounter: Payer: Self-pay | Admitting: Dermatology

## 2021-06-07 ENCOUNTER — Encounter: Payer: Medicare Other | Admitting: Dermatology

## 2021-10-20 ENCOUNTER — Ambulatory Visit (INDEPENDENT_AMBULATORY_CARE_PROVIDER_SITE_OTHER): Payer: Medicare Other | Admitting: Dermatology

## 2021-10-20 ENCOUNTER — Other Ambulatory Visit: Payer: Self-pay

## 2021-10-20 DIAGNOSIS — H00014 Hordeolum externum left upper eyelid: Secondary | ICD-10-CM

## 2021-10-20 DIAGNOSIS — L570 Actinic keratosis: Secondary | ICD-10-CM | POA: Diagnosis not present

## 2021-10-20 DIAGNOSIS — D229 Melanocytic nevi, unspecified: Secondary | ICD-10-CM

## 2021-10-20 DIAGNOSIS — L578 Other skin changes due to chronic exposure to nonionizing radiation: Secondary | ICD-10-CM

## 2021-10-20 DIAGNOSIS — D1800 Hemangioma unspecified site: Secondary | ICD-10-CM

## 2021-10-20 DIAGNOSIS — L720 Epidermal cyst: Secondary | ICD-10-CM | POA: Diagnosis not present

## 2021-10-20 DIAGNOSIS — L821 Other seborrheic keratosis: Secondary | ICD-10-CM

## 2021-10-20 DIAGNOSIS — Z1283 Encounter for screening for malignant neoplasm of skin: Secondary | ICD-10-CM | POA: Diagnosis not present

## 2021-10-20 DIAGNOSIS — Z86018 Personal history of other benign neoplasm: Secondary | ICD-10-CM

## 2021-10-20 DIAGNOSIS — L814 Other melanin hyperpigmentation: Secondary | ICD-10-CM

## 2021-10-20 MED ORDER — DOXYCYCLINE HYCLATE 100 MG PO TABS: ORAL_TABLET | ORAL | 0 refills | Status: DC

## 2021-10-20 NOTE — Patient Instructions (Addendum)
If You Need Anything After Your Visit ? ?If you have any questions or concerns for your doctor, please call our main line at [phone] and press option 4 to reach your doctor's medical assistant. If no one answers, please leave a voicemail as directed and we will return your call as soon as possible. Messages left after 4 pm will be answered the following business day.  ? ?You may also send us a message via MyChart. We typically respond to MyChart messages within 1-2 business days. ? ?For prescription refills, please ask your pharmacy to contact our office. Our fax number is [phone]. ? ?If you have an urgent issue when the clinic is closed that cannot wait until the next business day, you can page your doctor at the number below.   ? ?Please note that while we do our best to be available for urgent issues outside of office hours, we are not available 24/7.  ? ?If you have an urgent issue and are unable to reach us, you may choose to seek medical care at your doctor's office, retail clinic, urgent care center, or emergency room. ? ?If you have a medical emergency, please immediately call 911 or go to the emergency department. ? ?Pager Numbers ? ?- Dr. Kowalski: [phone] ? ?- Dr. Moye: [phone] ? ?- Dr. Stewart: [phone] ? ?In the event of inclement weather, please call our main line at [phone] for an update on the status of any delays or closures. ? ?Dermatology Medication Tips: ?Please keep the boxes that topical medications come in in order to help keep track of the instructions about where and how to use these. Pharmacies typically print the medication instructions only on the boxes and not directly on the medication tubes.  ? ?If your medication is too expensive, please contact our office at [phone] option 4 or send us a message through MyChart.  ? ?We are unable to tell what your co-pay for medications will be in advance as this is different depending on your insurance coverage.  However, we may be able to find a substitute medication at lower cost or fill out paperwork to get insurance to cover a needed medication.  ? ?If a prior authorization is required to get your medication covered by your insurance company, please allow us 1-2 business days to complete this process. ? ?Drug prices often vary depending on where the prescription is filled and some pharmacies may offer cheaper prices. ? ?The website www.goodrx.com contains coupons for medications through different pharmacies. The prices here do not account for what the cost may be with help from insurance (it may be cheaper with your insurance), but the website can give you the price if you did not use any insurance.  ?- You can print the associated coupon and take it with your prescription to the pharmacy.  ?- You may also stop by our office during regular business hours and pick up a GoodRx coupon card.  ?- If you need your prescription sent electronically to a different pharmacy, notify our office through [place] MyChart or by phone at [phone] option 4. ? ? ? ? ?Si Usted Necesita Algo Despu?s de Su Visita ? ?Tambi?n puede enviarnos un mensaje a trav?s de MyChart. Por lo general respondemos a los mensajes de MyChart en el transcurso de 1 a 2 d?as h?biles. ? ?Para renovar recetas, por favor pida a su farmacia que se ponga en contacto con nuestra oficina. Nuestro n?mero de fax es el [phone]. ? ?Si tiene   un asunto urgente cuando la cl?nica est? cerrada y que no puede esperar hasta el siguiente d?a h?bil, puede llamar/localizar a su doctor(a) al n?mero que aparece a continuaci?n.  ? ?Por favor, tenga en cuenta que aunque hacemos todo lo posible para estar disponibles para asuntos urgentes fuera del horario de oficina, no estamos disponibles las 24 horas del d?a, los 7 d?as de la semana.  ? ?Si tiene un problema urgente y no puede comunicarse con nosotros, puede optar por buscar atenci?n m?dica  en el consultorio de su  doctor(a), en una cl?nica privada, en un centro de atenci?n urgente o en una sala de emergencias. ? ?Si tiene Engineer, maintenance (IT) m?dica, por favor llame inmediatamente al 911 o vaya a la sala de emergencias. ? ?N?meros de b?per ? ?- Dr. Nehemiah Massed: [phone] ? ?- Dra. Moye: [phone] ? ?- Dra. Nicole Kindred: [phone] ? ?En caso de inclemencias del tiempo, por favor llame a nuestra l?nea principal al [phone] para una actualizaci?n sobre el estado de cualquier retraso o cierre. ? ?Consejos para la medicaci?n en dermatolog?a: ?Por favor, guarde las cajas en las que vienen los medicamentos de uso t?pico para ayudarle a seguir las instrucciones sobre d?nde y c?mo usarlos. Las farmacias generalmente imprimen las instrucciones del medicamento s?lo en las cajas y no directamente en los tubos del [place].  ? ?Si su medicamento es muy caro, por favor, p?ngase en contacto con Zigmund Daniel llamando al [phone] y presione la opci?n 4 o env?enos un mensaje a trav?s de MyChart.  ? ?No podemos decirle cu?l ser? su copago por los medicamentos por adelantado ya que esto es diferente dependiendo de la cobertura de su seguro. Sin embargo, es posible que podamos encontrar un medicamento sustituto a Electrical engineer un formulario para que el seguro cubra el medicamento que se considera necesario.  ? ?Si se requiere Ardelia Mems autorizaci?n previa para que su compa??a de seguros [place] su medicamento, por favor perm?tanos de 1 a 2 d?as h?biles para completar este proceso. ? ?Los precios de los medicamentos var?an con frecuencia dependiendo del Environmental consultant de d?nde se surte la receta y alguna farmacias pueden ofrecer precios m?s baratos. ? ?El sitio web www.goodrx.com tiene cupones para medicamentos de Airline pilot. Los precios aqu? no tienen en cuenta lo que podr?a costar con la ayuda del seguro (puede ser m?s barato con su seguro), pero el sitio web puede darle el precio si no utiliz? ning?n seguro.  ?- Puede imprimir el cup?n  correspondiente y llevarlo con su receta a la farmacia.  ?- Tambi?n puede pasar por nuestra oficina durante el horario de atenci?n regular y recoger una tarjeta de cupones de GoodRx.  ?- Si necesita que su receta se env?e electr?nicamente a Chiropodist, informe a nuestra oficina a trav?s de MyChart de [place] o por tel?fono llamando al [phone] y presione la opci?n 4. ? ? ?Pre-Operative Instructions ? ?You are scheduled for a surgical procedure at [facility]. We recommend you read the following instructions. If you have any questions or concerns, please call the office at [phone]. ? ?Shower and wash the entire body with soap and water the day of your surgery paying special attention to cleansing at and around the planned surgery site. ? ?Avoid aspirin or aspirin containing products at least fourteen (14) days prior to your surgical procedure and for at least one week (7 Days) after your surgical procedure. If you take aspirin on a regular basis for heart disease or history of stroke or  for any other reason, we may recommend you continue taking aspirin but please notify us if you take this on a regular basis. Aspirin can cause more bleeding to occur during surgery as well as prolonged bleeding and bruising after surgery.  ? ?Avoid other nonsteroidal pain medications at least one week prior to surgery and at least one week prior to your surgery. These include medications such as Ibuprofen (Motrin, Advil and Nuprin), Naprosyn, Voltaren, Relafen, etc. If medications are used for therapeutic reasons, please inform us as they can cause increased bleeding or prolonged bleeding during and bruising after surgical procedures.  ? ?Please advise [place] if you are taking any "blood thinner" medications such as Coumadin or Dipyridamole or Plavix or similar medications. These cause increased bleeding and prolonged bleeding during procedures and bruising after surgical procedures. We may have to  consider discontinuing these medications briefly prior to and shortly after your surgery if safe to do so.  ? ?Please inform us of all medications you are currently taking. All medications that are taken regularly sh

## 2021-10-20 NOTE — Progress Notes (Signed)
? ?Follow-Up Visit ?  ?Subjective  ?Javier Archer is a 71 y.o. male who presents for the following: Annual Exam (Hx dysplastic nevi and AK's ). The patient presents for Total-Body Skin Exam (TBSE) for skin cancer screening and mole check.  The patient has spots, moles and lesions to be evaluated, some may be new or changing and the patient has concerns that these could be cancer. ? ?The following portions of the chart were reviewed this encounter and updated as appropriate:  ? Tobacco  Allergies  Meds  Problems  Med Hx  Surg Hx  Fam Hx   ?  ?Review of Systems:  No other skin or systemic complaints except as noted in HPI or Assessment and Plan. ? ?Objective  ?Well appearing patient in no apparent distress; mood and affect are within normal limits. ? ?A full examination was performed including scalp, head, eyes, ears, nose, lips, neck, chest, axillae, abdomen, back, buttocks, bilateral upper extremities, bilateral lower extremities, hands, feet, fingers, toes, fingernails, and toenails. All findings within normal limits unless otherwise noted below. ? ?R mid back paraspinal ?4.0 cm firm SQ nodule.  ? ?L ear (see photos) x 2 (2) ?Erythematous thin papules/macules with gritty scale.  ? ? ? ? ? ?Assessment & Plan  ?Epidermal inclusion cyst ?R mid back paraspinal ?Benign-appearing. Exam most consistent with an epidermal inclusion cyst. Discussed that a cyst is a benign growth that can grow over time and sometimes get irritated or inflamed. Recommend observation if it is not bothersome. Discussed option of surgical excision to remove it if it is growing, symptomatic, or other changes noted. Please call for new or changing lesions so they can be evaluated. ? ?AK (actinic keratosis) (2) ?L ear (see photos) x 2 ?Destruction of lesion - L ear (see photos) x 2 ?Complexity: simple   ?Destruction method: cryotherapy   ?Informed consent: discussed and consent obtained   ?Timeout:  patient name, date of birth, surgical  site, and procedure verified ?Lesion destroyed using liquid nitrogen: Yes   ?Region frozen until ice ball extended beyond lesion: Yes   ?Outcome: patient tolerated procedure well with no complications   ?Post-procedure details: wound care instructions given   ? ?Hordeolum externum of left upper eyelid ?Left Upper Eyelid ?Start Doxycycline '100mg'$  po QD x 1 week. #30 0RF. PRN flares.  Doxycycline should be taken with food to prevent nausea. Do not lay down for 30 minutes after taking. Be cautious with sun exposure and use good sun protection while on this medication. Pregnant women should not take this medication.  ? ?doxycycline (VIBRA-TABS) 100 MG tablet - Left Upper Eyelid ?Take one po QD x 7 days. May continue PRN flares. Take with food. ? ?Skin cancer screening ? ?Lentigines ?- Scattered tan macules ?- Due to sun exposure ?- Benign-appearing, observe ?- Recommend daily broad spectrum sunscreen SPF 30+ to sun-exposed areas, reapply every 2 hours as needed. ?- Call for any changes ? ?Seborrheic Keratoses ?- Stuck-on, waxy, tan-brown papules and/or plaques  ?- Benign-appearing ?- Discussed benign etiology and prognosis. ?- Observe ?- Call for any changes ? ?Melanocytic Nevi ?- Tan-brown and/or pink-flesh-colored symmetric macules and papules ?- Benign appearing on exam today ?- Observation ?- Call clinic for new or changing moles ?- Recommend daily use of broad spectrum spf 30+ sunscreen to sun-exposed areas.  ? ?Hemangiomas ?- Red papules ?- Discussed benign nature ?- Observe ?- Call for any changes ? ?Actinic Damage ?- Chronic condition, secondary to cumulative UV/sun exposure ?-  diffuse scaly erythematous macules with underlying dyspigmentation ?- Recommend daily broad spectrum sunscreen SPF 30+ to sun-exposed areas, reapply every 2 hours as needed.  ?- Staying in the shade or wearing long sleeves, sun glasses (UVA+UVB protection) and wide brim hats (4-inch brim around the entire circumference of the hat) are also  recommended for sun protection.  ?- Call for new or changing lesions. ? ?History of Dysplastic Nevi ?- No evidence of recurrence today ?- Recommend regular full body skin exams ?- Recommend daily broad spectrum sunscreen SPF 30+ to sun-exposed areas, reapply every 2 hours as needed.  ?- Call if any new or changing lesions are noted between office visits ? ?Skin cancer screening performed today. ? ?Return in about 1 year (around 10/21/2022) for TBSE; surgical excision of cyst if patient would like lesion removed. ? ?I, Rudell Cobb, CMA, am acting as scribe for Sarina Ser, MD . ?Documentation: I have reviewed the above documentation for accuracy and completeness, and I agree with the above. ? ?Sarina Ser, MD ? ?

## 2021-10-21 ENCOUNTER — Encounter: Payer: Self-pay | Admitting: Dermatology

## 2022-01-27 IMAGING — CR DG CHEST 2V
2 series · 2 of 2 positions shown · non-contrast
Comparison: Chest radiograph dated 05/19/2010.

CLINICAL DATA: 69-year-old male with chest pain.

EXAM:
CHEST - 2 VIEW

[chest pa]
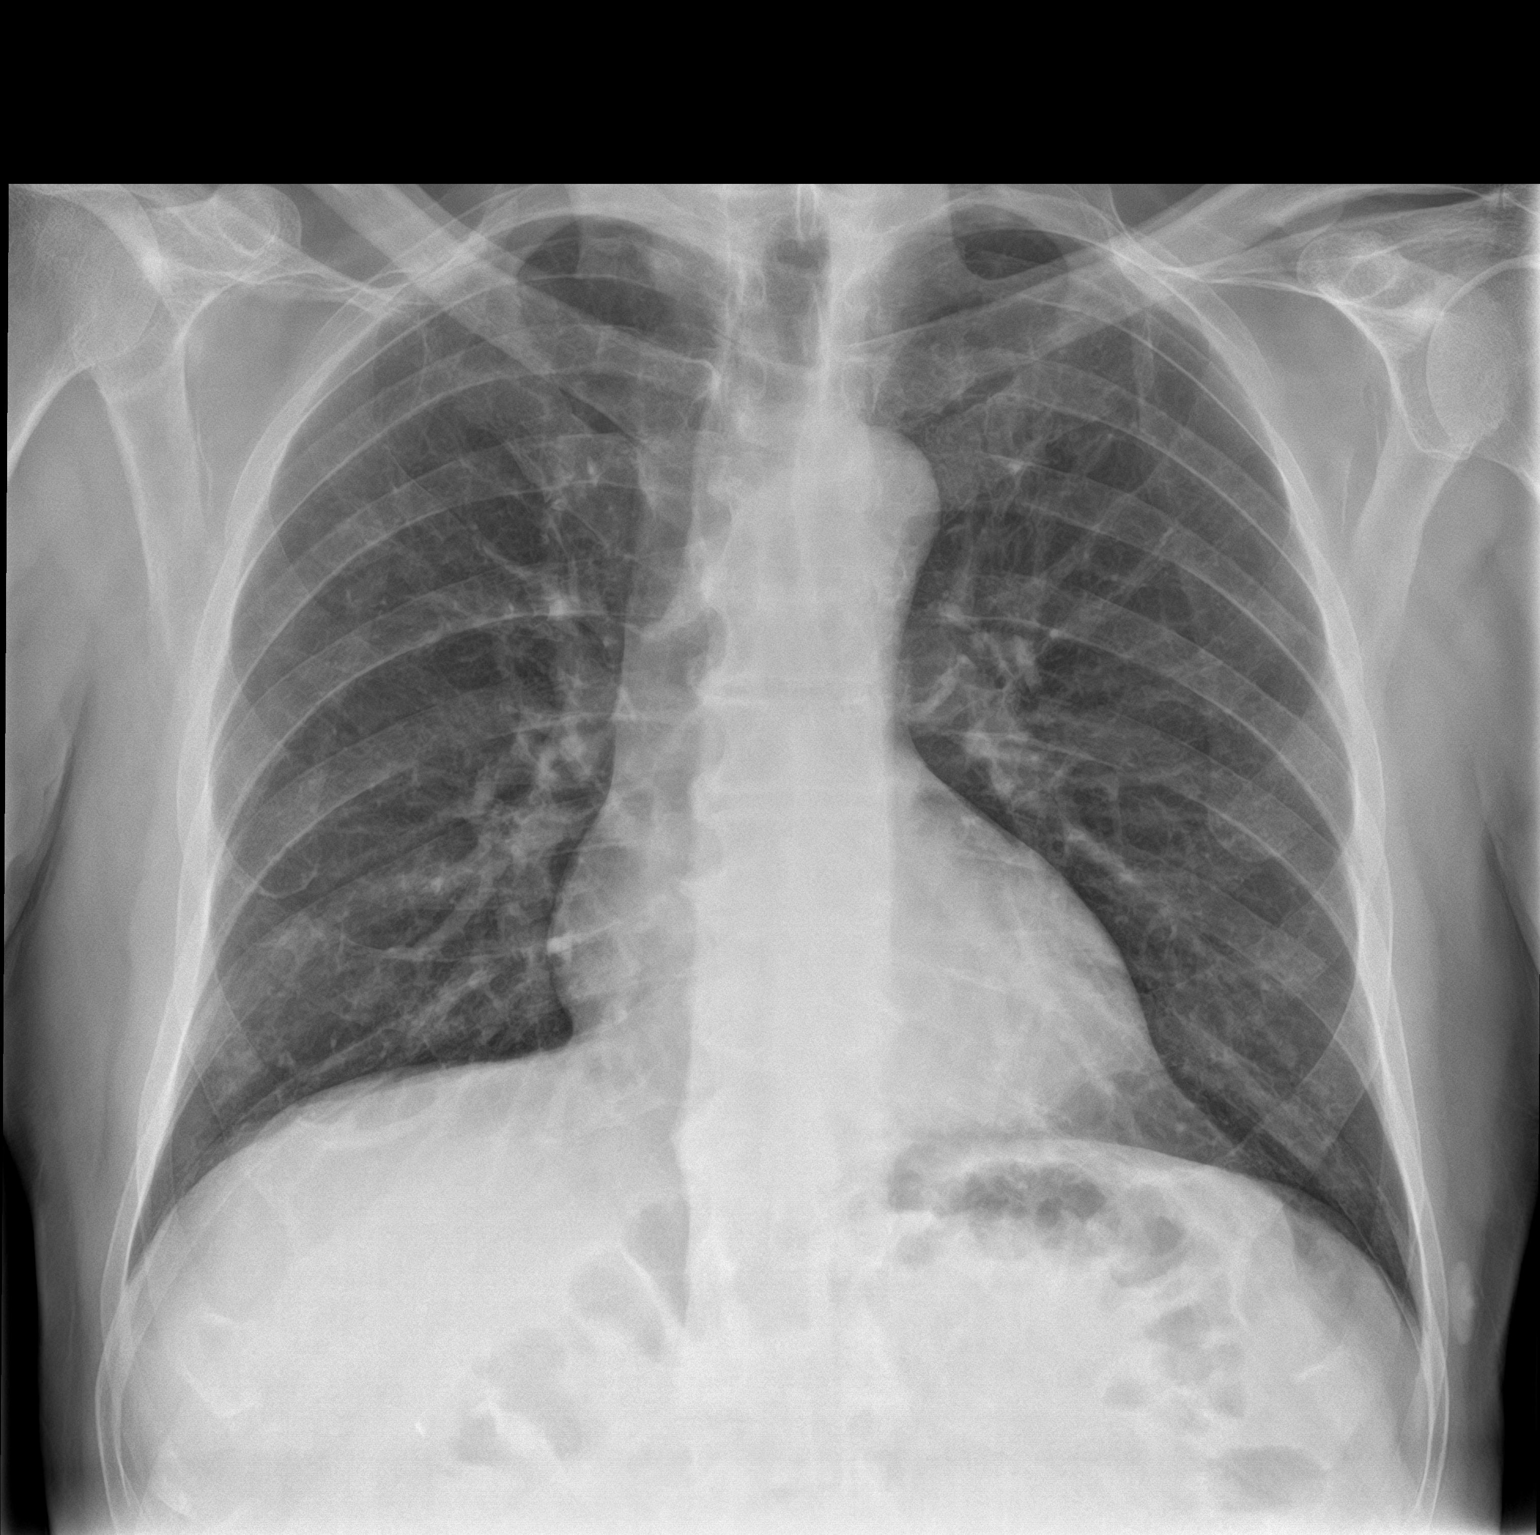

[chest lat]
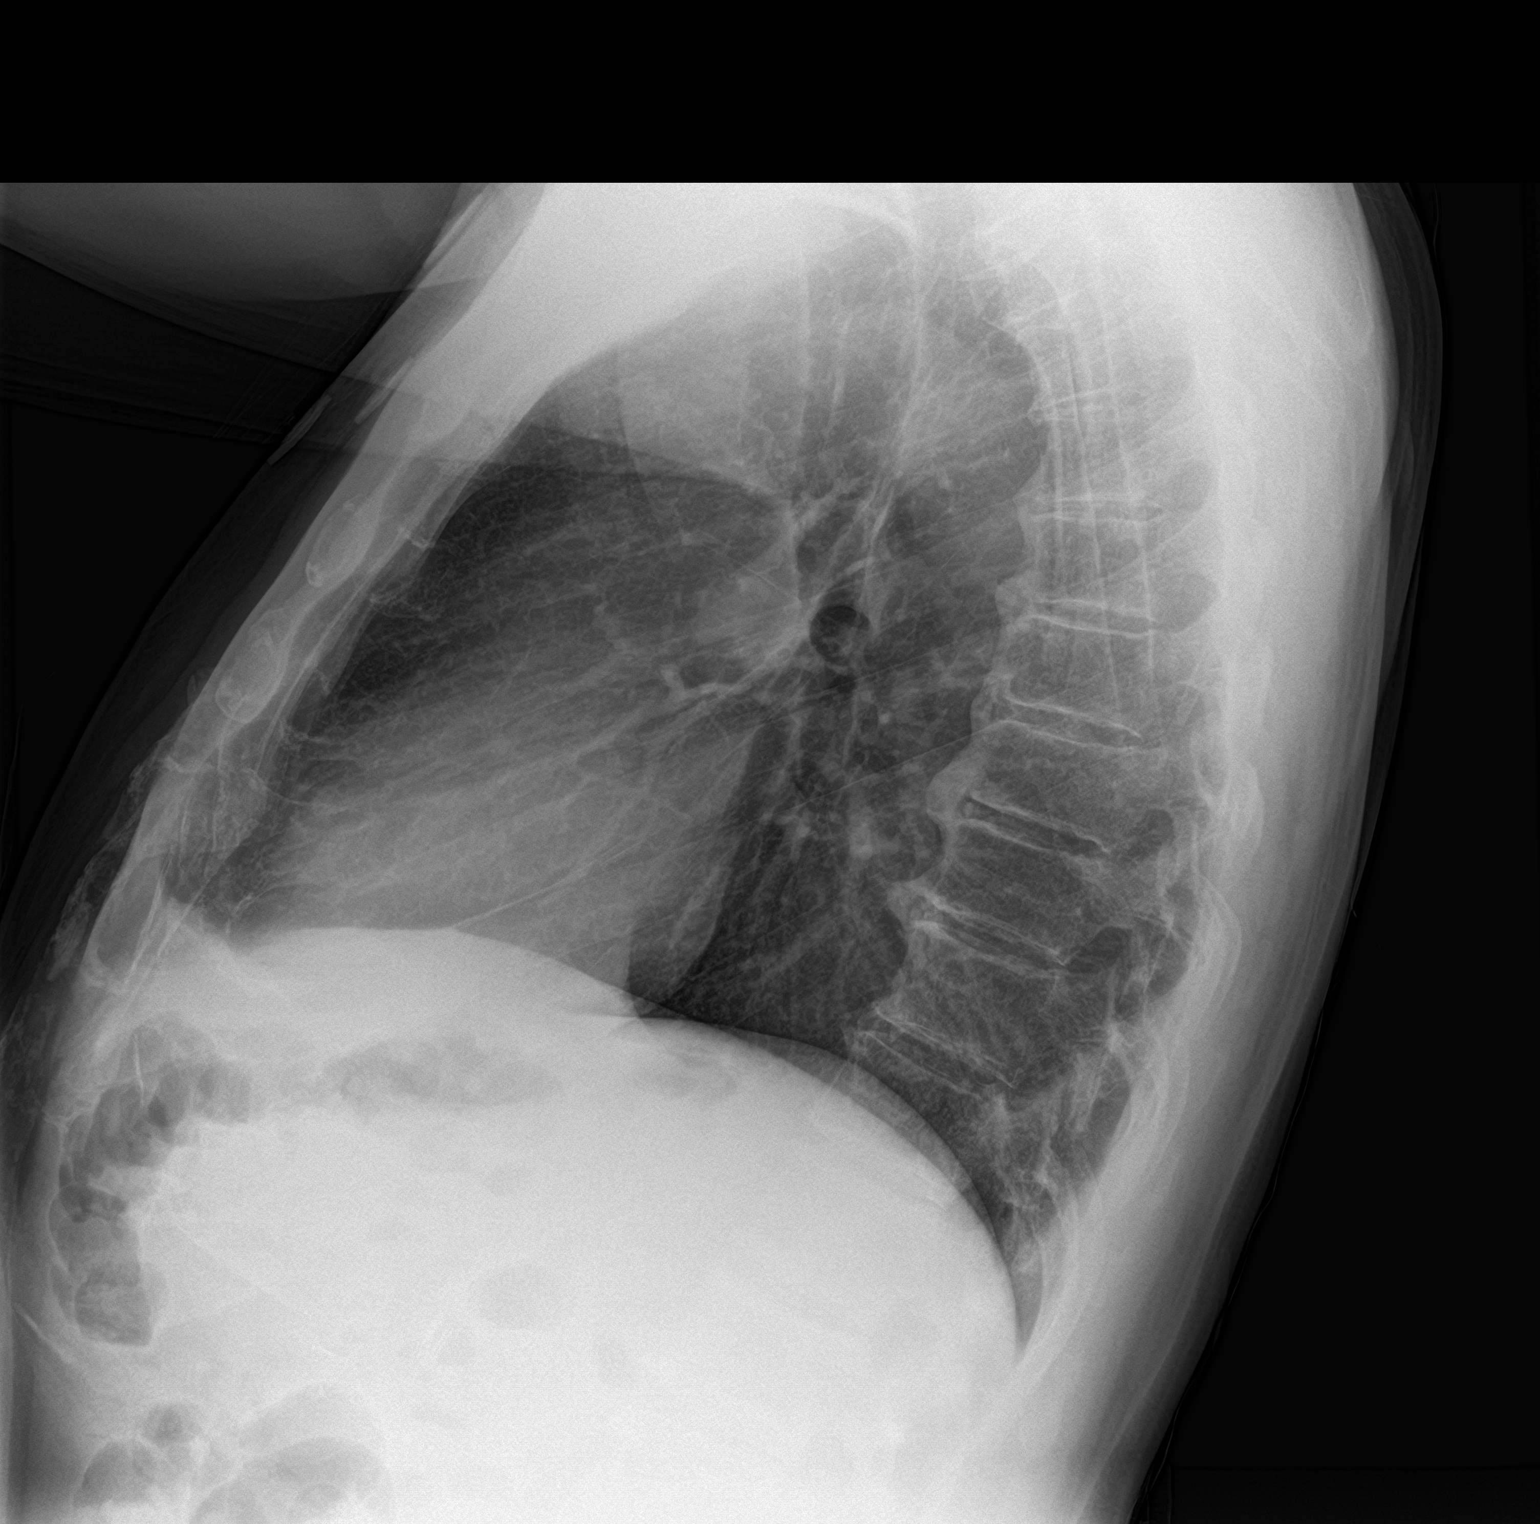

[2 of 2 positions shown; findings below may reference images not displayed]

FINDINGS: No focal consolidation, pleural effusion, pneumothorax. The cardiac
silhouette is within limits. No acute osseous pathology.
Degenerative changes of the spine.
IMPRESSION: No active cardiopulmonary disease.

## 2022-10-26 ENCOUNTER — Ambulatory Visit (INDEPENDENT_AMBULATORY_CARE_PROVIDER_SITE_OTHER): Payer: Medicare Other | Admitting: Dermatology

## 2022-10-26 ENCOUNTER — Encounter: Payer: Self-pay | Admitting: Dermatology

## 2022-10-26 VITALS — BP 114/76

## 2022-10-26 DIAGNOSIS — L814 Other melanin hyperpigmentation: Secondary | ICD-10-CM

## 2022-10-26 DIAGNOSIS — Z86018 Personal history of other benign neoplasm: Secondary | ICD-10-CM | POA: Diagnosis not present

## 2022-10-26 DIAGNOSIS — L570 Actinic keratosis: Secondary | ICD-10-CM | POA: Diagnosis not present

## 2022-10-26 DIAGNOSIS — L821 Other seborrheic keratosis: Secondary | ICD-10-CM

## 2022-10-26 DIAGNOSIS — D229 Melanocytic nevi, unspecified: Secondary | ICD-10-CM

## 2022-10-26 DIAGNOSIS — L720 Epidermal cyst: Secondary | ICD-10-CM

## 2022-10-26 DIAGNOSIS — D1801 Hemangioma of skin and subcutaneous tissue: Secondary | ICD-10-CM

## 2022-10-26 DIAGNOSIS — L578 Other skin changes due to chronic exposure to nonionizing radiation: Secondary | ICD-10-CM

## 2022-10-26 DIAGNOSIS — L820 Inflamed seborrheic keratosis: Secondary | ICD-10-CM

## 2022-10-26 DIAGNOSIS — Z1283 Encounter for screening for malignant neoplasm of skin: Secondary | ICD-10-CM | POA: Diagnosis not present

## 2022-10-26 NOTE — Patient Instructions (Addendum)
Cryotherapy Aftercare  Wash gently with soap and water everyday.   Apply Vaseline and Band-Aid daily until healed.     Due to recent changes in healthcare laws, you may see results of your pathology and/or laboratory studies on MyChart before the doctors have had a chance to review them. We understand that in some cases there may be results that are confusing or concerning to you. Please understand that not all results are received at the same time and often the doctors may need to interpret multiple results in order to provide you with the best plan of care or course of treatment. Therefore, we ask that you please give us 2 business days to thoroughly review all your results before contacting the office for clarification. Should we see a critical lab result, you will be contacted sooner.   If You Need Anything After Your Visit  If you have any questions or concerns for your doctor, please call our main line at [phone] and press option 4 to reach your doctor's medical assistant. If no one answers, please leave a voicemail as directed and we will return your call as soon as possible. Messages left after 4 pm will be answered the following business day.   You may also send us a message via MyChart. We typically respond to MyChart messages within 1-2 business days.  For prescription refills, please ask your pharmacy to contact our office. Our fax number is [phone].  If you have an urgent issue when the clinic is closed that cannot wait until the next business day, you can page your doctor at the number below.    Please note that while we do our best to be available for urgent issues outside of office hours, we are not available 24/7.   If you have an urgent issue and are unable to reach us, you may choose to seek medical care at your doctor's office, retail clinic, urgent care center, or emergency room.  If you have a medical emergency, please immediately call 911 or go to the  emergency department.  Pager Numbers  - Dr. Kowalski: [phone]  - Dr. Moye: [phone]  - Dr. Stewart: [phone]  In the event of inclement weather, please call our main line at [phone] for an update on the status of any delays or closures.  Dermatology Medication Tips: Please keep the boxes that topical medications come in in order to help keep track of the instructions about where and how to use these. Pharmacies typically print the medication instructions only on the boxes and not directly on the medication tubes.   If your medication is too expensive, please contact our office at [phone] option 4 or send us a message through MyChart.   We are unable to tell what your co-pay for medications will be in advance as this is different depending on your insurance coverage. However, we may be able to find a substitute medication at lower cost or fill out paperwork to get insurance to cover a needed medication.   If a prior authorization is required to get your medication covered by your insurance company, please allow us 1-2 business days to complete this process.  Drug prices often vary depending on where the prescription is filled and some pharmacies may offer cheaper prices.  The website www.goodrx.com contains coupons for medications through different pharmacies. The prices here do not account for what the cost may be with help from insurance (it may be cheaper with your insurance), but the website can   give you the price if you did not use any insurance.  - You can print the associated coupon and take it with your prescription to the pharmacy.  - You may also stop by our office during regular business hours and pick up a GoodRx coupon card.  - If you need your prescription sent electronically to a different pharmacy, notify our office through [facility] MyChart or by phone at [phone] option 4.     Si Usted Necesita Algo Despus de Su Visita  Tambin puede  enviarnos un mensaje a travs de MyChart. Por lo general respondemos a los mensajes de MyChart en el transcurso de 1 a 2 das hbiles.  Para renovar recetas, por favor pida a su farmacia que se ponga en contacto con nuestra oficina. Nuestro nmero de fax es el [phone].  Si tiene un asunto urgente cuando la clnica est cerrada y que no puede esperar hasta el siguiente da hbil, puede llamar/localizar a su doctor(a) al nmero que aparece a continuacin.   Por favor, tenga en cuenta que aunque hacemos todo lo posible para estar disponibles para asuntos urgentes fuera del horario de oficina, no estamos disponibles las 24 horas del da, los 7 das de la semana.   Si tiene un problema urgente y no puede comunicarse con nosotros, puede optar por buscar atencin mdica  en el consultorio de su doctor(a), en una clnica privada, en un centro de atencin urgente o en una sala de emergencias.  Si tiene una emergencia mdica, por favor llame inmediatamente al 911 o vaya a la sala de emergencias.  Nmeros de bper  - Dr. Kowalski: [phone]  - Dra. Moye: [phone]  - Dra. Stewart: [phone]  En caso de inclemencias del tiempo, por favor llame a nuestra lnea principal al [phone] para una actualizacin sobre el estado de cualquier retraso o cierre.  Consejos para la medicacin en dermatologa: Por favor, guarde las cajas en las que vienen los medicamentos de uso tpico para ayudarle a seguir las instrucciones sobre dnde y cmo usarlos. Las farmacias generalmente imprimen las instrucciones del medicamento slo en las cajas y no directamente en los tubos del medicamento.   Si su medicamento es muy caro, por favor, pngase en contacto con nuestra oficina llamando al [phone] y presione la opcin 4 o envenos un mensaje a travs de MyChart.   No podemos decirle cul ser su copago por los medicamentos por adelantado ya que esto es diferente dependiendo de la cobertura de su seguro.  Sin embargo, es posible que podamos encontrar un medicamento sustituto a menor costo o llenar un formulario para que el seguro cubra el medicamento que se considera necesario.   Si se requiere una autorizacin previa para que su compaa de seguros cubra su medicamento, por favor permtanos de 1 a 2 das hbiles para completar este proceso.  Los precios de los medicamentos varan con frecuencia dependiendo del lugar de dnde se surte la receta y alguna farmacias pueden ofrecer precios ms baratos.  El sitio web www.goodrx.com tiene cupones para medicamentos de diferentes farmacias. Los precios aqu no tienen en cuenta lo que podra costar con la ayuda del seguro (puede ser ms barato con su seguro), pero el sitio web puede darle el precio si no utiliz ningn seguro.  - Puede imprimir el cupn correspondiente y llevarlo con su receta a la farmacia.  - Tambin puede pasar por nuestra oficina durante el horario de atencin regular y recoger una tarjeta de cupones de GoodRx.  -   Si necesita que su receta se enve electrnicamente a una farmacia diferente, informe a nuestra oficina a travs de MyChart de [facility] o por telfono llamando al [phone] y presione la opcin 4.  

## 2022-10-26 NOTE — Progress Notes (Signed)
Follow-Up Visit   Subjective  Javier Archer is a 72 y.o. male who presents for the following: Skin Cancer Screening and Full Body Skin Exam - History of dysplastic nevi The patient presents for Total-Body Skin Exam (TBSE) for skin cancer screening and mole check. The patient has spots, moles and lesions to be evaluated, some may be new or changing and the patient has concerns that these could be cancer.  The following portions of the chart were reviewed this encounter and updated as appropriate: medications, allergies, medical history  Review of Systems:  No other skin or systemic complaints except as noted in HPI or Assessment and Plan.  Objective  Well appearing patient in no apparent distress; mood and affect are within normal limits.  A full examination was performed including scalp, head, eyes, ears, nose, lips, neck, chest, axillae, abdomen, back, buttocks, bilateral upper extremities, bilateral lower extremities, hands, feet, fingers, toes, fingernails, and toenails. All findings within normal limits unless otherwise noted below.   Relevant physical exam findings are noted in the Assessment and Plan.   Assessment & Plan   LENTIGINES, SEBORRHEIC KERATOSES, HEMANGIOMAS - Benign normal skin lesions - Benign-appearing - Call for any changes  MELANOCYTIC NEVI - Tan-brown and/or pink-flesh-colored symmetric macules and papules - Benign appearing on exam today - Observation - Call clinic for new or changing moles - Recommend daily use of broad spectrum spf 30+ sunscreen to sun-exposed areas.   ACTINIC DAMAGE - Chronic condition, secondary to cumulative UV/sun exposure - diffuse scaly erythematous macules with underlying dyspigmentation - Recommend daily broad spectrum sunscreen SPF 30+ to sun-exposed areas, reapply every 2 hours as needed.  - Staying in the shade or wearing long sleeves, sun glasses (UVA+UVB protection) and wide brim hats (4-inch brim around the entire  circumference of the hat) are also recommended for sun protection.  - Call for new or changing lesions.  SKIN CANCER SCREENING PERFORMED TODAY.  EPIDERMAL INCLUSION CYST Exam: 4.5 cm firm papule at right mid back paraspinal  Benign-appearing. Exam most consistent with an epidermal inclusion cyst. Discussed that a cyst is a benign growth that can grow over time and sometimes get irritated or inflamed. Recommend observation if it is not bothersome. Discussed option of surgical excision to remove it if it is growing, symptomatic, or other changes noted. Please call for new or changing lesions so they can be evaluated.  INFLAMED SEBORRHEIC KERATOSIS Exam: Erythematous keratotic or waxy stuck-on papule or plaque.  Symptomatic, irritating, patient would like treated.  Benign-appearing.  Call clinic for new or changing lesions.   Prior to procedure, discussed risks of blister formation, small wound, skin dyspigmentation, or rare scar following treatment. Recommend Vaseline ointment to treated areas while healing.  Destruction Procedure Note Destruction method: cryotherapy   Informed consent: discussed and consent obtained   Lesion destroyed using liquid nitrogen: Yes   Outcome: patient tolerated procedure well with no complications   Post-procedure details: wound care instructions given   Locations: left ear # of Lesions Treated: 1  ACTINIC KERATOSIS Exam: Erythematous thin papules/macules with gritty scale  Actinic keratoses are precancerous spots that appear secondary to cumulative UV radiation exposure/sun exposure over time. They are chronic with expected duration over 1 year. A portion of actinic keratoses will progress to squamous cell carcinoma of the skin. It is not possible to reliably predict which spots will progress to skin cancer and so treatment is recommended to prevent development of skin cancer.  Recommend daily broad spectrum sunscreen  SPF 30+ to sun-exposed areas, reapply  every 2 hours as needed.  Recommend staying in the shade or wearing long sleeves, sun glasses (UVA+UVB protection) and wide brim hats (4-inch brim around the entire circumference of the hat). Call for new or changing lesions.  Treatment Plan:  Prior to procedure, discussed risks of blister formation, small wound, skin dyspigmentation, or rare scar following cryotherapy. Recommend Vaseline ointment to treated areas while healing.  Destruction Procedure Note Destruction method: cryotherapy   Informed consent: discussed and consent obtained   Lesion destroyed using liquid nitrogen: Yes   Outcome: patient tolerated procedure well with no complications   Post-procedure details: wound care instructions given   Locations: scalp, left temple # of Lesions Treated: 5  History of Dysplastic Nevi - No evidence of recurrence today - Recommend regular full body skin exams - Recommend daily broad spectrum sunscreen SPF 30+ to sun-exposed areas, reapply every 2 hours as needed.  - Call if any new or changing lesions are noted between office visits  Return in about 1 year (around 10/26/2023) for TBSE.  I, Ashok Cordia, CMA, am acting as scribe for Sarina Ser, MD .  Documentation: I have reviewed the above documentation for accuracy and completeness, and I agree with the above.  Sarina Ser, MD

## 2023-10-16 NOTE — Progress Notes (Unsigned)
 GI Office Note    Referring Provider: Alvina Filbert, MD Primary Care Physician:  Alvina Filbert, MD  Primary Gastroenterologist: Gerrit Friends.Rourk, MD  Chief Complaint   No chief complaint on file.   History of Present Illness   Javier Archer is a 73 y.o. male presenting today at the request of Alvina Filbert, MD for IBS with constipation.  Seen by Dr. Servando Snare last in January 2022 for reflux.  Had recently been to the ED for symptoms consistent with GERD and treated with Carafate and Prilosec and reported his symptoms had completely resolved and he had come off medication without any return of symptoms.  He did report a history of hiatal hernia and due for colonoscopy due to personal history of colon polyps.  Advised that he would check when he was due for colonoscopy and will be set up for EGD at that time.  Does not appear that patient was set up for colonoscopy or EGD.  Per referral paperwork, during AWV he mentioned constipation, currently taking MiraLAX and Benefiber.  He requested additional medication for as needed constipation.  He reported he was drinking plenty of fluids and eating a well-rounded diet.  States he previously was encouraged to follow-up primary care provider regarding this and he was recommended to try Dulcolax to help stimulate his bowels and to continue MiraLAX and Benefiber.  GI referral given.  Today:  Last colonoscopy: *** Reported personal history of colon polyps.  Wt Readings from Last 3 Encounters:  08/05/20 205 lb 9.6 oz (93.3 kg)  06/23/20 195 lb (88.5 kg)  06/11/20 196 lb (88.9 kg)    Current Outpatient Medications  Medication Sig Dispense Refill   cetirizine (ZYRTEC) 10 MG tablet      doxycycline (VIBRA-TABS) 100 MG tablet Take one po QD x 7 days. May continue PRN flares. Take with food. 30 tablet 0   fluticasone (FLONASE) 50 MCG/ACT nasal spray Place into both nostrils.     mupirocin ointment (BACTROBAN) 2 % Apply 1 application topically  daily. With dressing changes (Patient not taking: Reported on 10/20/2021) 22 g 0   omeprazole (PRILOSEC) 20 MG capsule Take 1 capsule (20 mg total) by mouth 2 (two) times daily. 60 capsule 1   sucralfate (CARAFATE) 1 g tablet Take 1 tablet (1 g total) by mouth 4 (four) times daily. 120 tablet 1   No current facility-administered medications for this visit.    Past Medical History:  Diagnosis Date   Hx of dysplastic nevus 05/24/2018    right low back spinal inf, moderate   Hx of dysplastic nevus 05/24/2018   right low back 6.0 cm lat to spine, moderate   Hx of dysplastic nevus 05/24/2018   right low back 7.5 cm lat to spine   Seasonal allergies     Past Surgical History:  Procedure Laterality Date   CHOLECYSTECTOMY  2000   HERNIA REPAIR  2016    No family history on file.  Allergies as of 10/17/2023   (No Known Allergies)    Social History   Socioeconomic History   Marital status: Married    Spouse name: Shem Plemmons   Number of children: Not on file   Years of education: Not on file   Highest education level: Not on file  Occupational History   Not on file  Tobacco Use   Smoking status: Never   Smokeless tobacco: Never  Substance and Sexual Activity   Alcohol use: Yes   Drug use: Never  Sexual activity: Not on file  Other Topics Concern   Not on file  Social History Narrative   Not on file   Social Drivers of Health   Financial Resource Strain: Not on file  Food Insecurity: Not on file  Transportation Needs: Not on file  Physical Activity: Not on file  Stress: Not on file  Social Connections: Not on file  Intimate Partner Violence: Not on file     Review of Systems   Gen: Denies any fever, chills, fatigue, weight loss, lack of appetite.  CV: Denies chest pain, heart palpitations, peripheral edema, syncope.  Resp: Denies shortness of breath at rest or with exertion. Denies wheezing or cough.  GI: see HPI GU : Denies urinary burning, urinary  frequency, urinary hesitancy MS: Denies joint pain, muscle weakness, cramps, or limitation of movement.  Derm: Denies rash, itching, dry skin Psych: Denies depression, anxiety, memory loss, and confusion Heme: Denies bruising, bleeding, and enlarged lymph nodes.  Physical Exam   There were no vitals taken for this visit.  General:   Alert and oriented. Pleasant and cooperative. Well-nourished and well-developed.  Head:  Normocephalic and atraumatic. Eyes:  Without icterus, sclera clear and conjunctiva pink.  Ears:  Normal auditory acuity. Mouth:  No deformity or lesions, oral mucosa pink.  Lungs:  Clear to auscultation bilaterally. No wheezes, rales, or rhonchi. No distress.  Heart:  S1, S2 present without murmurs appreciated.  Abdomen:  +BS, soft, non-tender and non-distended. No HSM noted. No guarding or rebound. No masses appreciated.  Rectal:  Deferred *** Msk:  Symmetrical without gross deformities. Normal posture. Extremities:  Without edema. Neurologic:  Alert and  oriented x4;  grossly normal neurologically. Skin:  Intact without significant lesions or rashes. Psych:  Alert and cooperative. Normal mood and affect.  Assessment   Javier Archer is a 73 y.o. male with a history of dysplastic nevus, allergies, and prior reflux resolved with Carafate and PPI presenting today for evaluation of constipation.  IBS with constipation:  PLAN   ***    Brooke Bonito, MSN, FNP-BC, AGACNP-BC [facility]

## 2023-10-17 ENCOUNTER — Ambulatory Visit (INDEPENDENT_AMBULATORY_CARE_PROVIDER_SITE_OTHER): Admitting: Gastroenterology

## 2023-10-17 ENCOUNTER — Encounter: Payer: Self-pay | Admitting: Gastroenterology

## 2023-10-17 ENCOUNTER — Other Ambulatory Visit: Payer: Self-pay | Admitting: Gastroenterology

## 2023-10-17 VITALS — BP 134/84 | HR 54 | Temp 97.6°F | Ht 73.0 in | Wt 202.0 lb

## 2023-10-17 DIAGNOSIS — K581 Irritable bowel syndrome with constipation: Secondary | ICD-10-CM | POA: Diagnosis not present

## 2023-10-17 DIAGNOSIS — R140 Abdominal distension (gaseous): Secondary | ICD-10-CM

## 2023-10-17 DIAGNOSIS — K219 Gastro-esophageal reflux disease without esophagitis: Secondary | ICD-10-CM | POA: Diagnosis not present

## 2023-10-17 DIAGNOSIS — R110 Nausea: Secondary | ICD-10-CM

## 2023-10-17 DIAGNOSIS — Z860100 Personal history of colon polyps, unspecified: Secondary | ICD-10-CM

## 2023-10-17 MED ORDER — OMEPRAZOLE 20 MG PO CPDR: 20.0000 mg | DELAYED_RELEASE_CAPSULE | Freq: Every day | ORAL | 3 refills | Status: DC

## 2023-10-17 NOTE — Patient Instructions (Addendum)
 Increase miralax to twice daily, 1 capful in 8 oz water or fluid of your choice.  You may also start a magnesium supplement daily, magnesium oxide.  Continue your Benefiber daily.  Please ensure you are getting at least 4-6 glasses of water daily  Please perform H. pylori breath test at [facility].  We will call you with results once they have been received. Please allow 3-5 business days for review. 2 locations for [facility] in [place]:              1. [address], [place]              2. [address] [place], [place]    Follow a GERD diet:  Avoid fried, fatty, greasy, spicy, citrus foods. Avoid caffeine and carbonated beverages. Avoid chocolate. Try eating 4-6 small meals a day rather than 3 large meals. Do not eat within 3 hours of laying down. Prop head of bed up on wood or bricks to create a 6 inch incline.  We will plan to follow-up in 3 months, sooner if needed.  If you have any worsening of your constipation between now and your follow-up and current regimen are not working please let me know.  Please fill out records request to get your prior colonoscopy records from [place]\  It was a pleasure to see you today. I want to create trusting relationships with patients. If you receive a survey regarding your visit,  I greatly appreciate you taking time to fill this out on paper or through your MyChart. I value your feedback.  Brooke Bonito, MSN, FNP-BC, AGACNP-BC [facility]

## 2023-10-19 LAB — H. PYLORI BREATH TEST: H pylori Breath Test: NEGATIVE

## 2023-10-23 ENCOUNTER — Telehealth: Payer: Self-pay | Admitting: *Deleted

## 2023-10-23 NOTE — Telephone Encounter (Signed)
 Pt called and states that he is having increased  stomach and lower back pain especially in his lower right side. He states he has been taking the medication  as prescribed at his last OV. He states he has not had a bowel movement since Friday morning and it was small.

## 2023-10-23 NOTE — Telephone Encounter (Signed)
Spoke to pt, informed him of recommendations. Pt voiced understanding.

## 2023-10-25 NOTE — Telephone Encounter (Signed)
 Mychart not set up. Can we try calling him and see how his mini bowel prep went.

## 2023-10-25 NOTE — Telephone Encounter (Signed)
 Spoke to pt, this morning and he states he has had several bowel movements after he did the mini bowel prep, but he is still having stomach pains and lower right back pains. He states that his stomach has a burning sensation and then he starts to feel nauseated. His back hurts on the lower right side near his kidneys.

## 2023-10-25 NOTE — Telephone Encounter (Signed)
 Spoke to pt, informed him of recommendations. Pt voiced understanding. Informed pt to call next week with update.

## 2023-10-30 ENCOUNTER — Telehealth: Payer: Self-pay | Admitting: *Deleted

## 2023-10-30 DIAGNOSIS — R109 Unspecified abdominal pain: Secondary | ICD-10-CM

## 2023-10-30 NOTE — Telephone Encounter (Signed)
 Spoke to pt, he states that he is still having pains in his lower back. He states that his stomach is burning and he feels nauseated. He states he feels worse in the afternoon. He states he is still taking MiraLax twice daily and having regular bowel movements.

## 2023-10-31 ENCOUNTER — Other Ambulatory Visit: Payer: Self-pay | Admitting: Gastroenterology

## 2023-10-31 DIAGNOSIS — M549 Dorsalgia, unspecified: Secondary | ICD-10-CM

## 2023-10-31 DIAGNOSIS — R1013 Epigastric pain: Secondary | ICD-10-CM

## 2023-10-31 DIAGNOSIS — R1030 Lower abdominal pain, unspecified: Secondary | ICD-10-CM

## 2023-10-31 NOTE — Telephone Encounter (Signed)
Pt called back and is aware of appt details 

## 2023-10-31 NOTE — Telephone Encounter (Signed)
 Spoke to pt informed him of recommendations. Pt voiced understanding. Pt states he thinks that he would like to have a CT scan to further evaluate the cause of his stomach and back pain.

## 2023-10-31 NOTE — Telephone Encounter (Signed)
 Noted.

## 2023-10-31 NOTE — Telephone Encounter (Signed)
 LMOVM to call back for pt to give CT appointment details.

## 2023-11-01 ENCOUNTER — Ambulatory Visit: Payer: Medicare Other | Admitting: Dermatology

## 2023-11-07 ENCOUNTER — Emergency Department

## 2023-11-07 ENCOUNTER — Other Ambulatory Visit: Payer: Self-pay

## 2023-11-07 ENCOUNTER — Emergency Department
Admission: EM | Admit: 2023-11-07 | Discharge: 2023-11-07 | Disposition: A | Discharge status: Home / Self Care | Attending: Emergency Medicine | Admitting: Emergency Medicine

## 2023-11-07 DIAGNOSIS — R1031 Right lower quadrant pain: Secondary | ICD-10-CM | POA: Diagnosis present

## 2023-11-07 DIAGNOSIS — K760 Fatty (change of) liver, not elsewhere classified: Secondary | ICD-10-CM

## 2023-11-07 DIAGNOSIS — M545 Low back pain, unspecified: Secondary | ICD-10-CM | POA: Diagnosis not present

## 2023-11-07 DIAGNOSIS — K5730 Diverticulosis of large intestine without perforation or abscess without bleeding: Secondary | ICD-10-CM | POA: Diagnosis not present

## 2023-11-07 DIAGNOSIS — R109 Unspecified abdominal pain: Secondary | ICD-10-CM

## 2023-11-07 DIAGNOSIS — K5790 Diverticulosis of intestine, part unspecified, without perforation or abscess without bleeding: Secondary | ICD-10-CM

## 2023-11-07 LAB — COMPREHENSIVE METABOLIC PANEL WITH GFR
ALT: 20 U/L (ref 0–44)
AST: 20 U/L (ref 15–41)
Albumin: 4.2 g/dL (ref 3.5–5.0)
Alkaline Phosphatase: 66 U/L (ref 38–126)
Anion gap: 8 (ref 5–15)
BUN: 19 mg/dL (ref 8–23)
CO2: 27 mmol/L (ref 22–32)
Calcium: 9.5 mg/dL (ref 8.9–10.3)
Chloride: 107 mmol/L (ref 98–111)
Creatinine, Ser: 1.13 mg/dL (ref 0.61–1.24)
GFR, Estimated: >60 mL/min (ref >60)
Glucose, Bld: 110 mg/dL — ABNORMAL HIGH (ref 70–99)
Potassium: 4.3 mmol/L (ref 3.5–5.1)
Sodium: 142 mmol/L (ref 135–145)
Total Bilirubin: 0.6 mg/dL (ref 0.0–1.2)
Total Protein: 7.2 g/dL (ref 6.5–8.1)

## 2023-11-07 LAB — URINALYSIS, ROUTINE W REFLEX MICROSCOPIC
Bilirubin Urine: NEGATIVE
Glucose, UA: NEGATIVE mg/dL
Hgb urine dipstick: NEGATIVE
Ketones, ur: NEGATIVE mg/dL
Leukocytes,Ua: NEGATIVE
Nitrite: NEGATIVE
Protein, ur: NEGATIVE mg/dL
Specific Gravity, Urine: 1.013 (ref 1.005–1.030)
pH: 7 (ref 5.0–8.0)

## 2023-11-07 LAB — CBC
HCT: 43.8 % (ref 39.0–52.0)
Hemoglobin: 15.1 g/dL (ref 13.0–17.0)
MCH: 30.8 pg (ref 26.0–34.0)
MCHC: 34.5 g/dL (ref 30.0–36.0)
MCV: 89.4 fL (ref 80.0–100.0)
Platelets: 278 10*3/uL (ref 150–400)
RBC: 4.9 MIL/uL (ref 4.22–5.81)
RDW: 12 % (ref 11.5–15.5)
WBC: 8.1 10*3/uL (ref 4.0–10.5)
nRBC: 0 % (ref 0.0–0.2)

## 2023-11-07 LAB — LIPASE, BLOOD: Lipase: 32 U/L (ref 11–51)

## 2023-11-07 MED ORDER — IOHEXOL 300 MG/ML  SOLN
100.0000 mL | Freq: Once | INTRAMUSCULAR | Status: AC | PRN
  Administered 2023-11-07: 100 mL via INTRAVENOUS

## 2023-11-07 MED ORDER — DULCOLAX 5 MG PO TBEC: 5.0000 mg | DELAYED_RELEASE_TABLET | Freq: Every day | ORAL | 0 refills | Status: AC | PRN

## 2023-11-07 NOTE — ED Provider Notes (Signed)
 [facility] Provider Note    Event Date/Time   First MD Initiated Contact with Patient 11/07/23 1703     (approximate)   History   Abdominal Pain   HPI  Javier Archer is a 73 y.o. male with history of IBS, constipation, diverticulosis, presenting with lower abdominal pain as well as constipation.  Patient states that symptoms have been going on for months.  Also with lower back pain has been ongoing for months.  Is passing gas, had a small bowel movement yesterday.  No nausea or vomiting, no testicular pain or swelling.  No recent trauma or falls.  He denies any weakness or numbness or incontinence, no saddle anesthesia, no fever, chest pain, shortness of breath, cough, urinary symptoms, no melena, hematochezia, hematuria, dysuria.  He presented here because he was supposed to get a CAT scan but was scheduled for May.  Patient states that he uses MiraLAX twice daily as well as Benefiber.  He is not on Dulcolax at this time but has taken Dulcolax in the past.  On admission chart review, he was seen by gastroenterology in March, has history of IBS with constipation, has also history of GERD treated with Carafate and Prilosec.  For the constipation has been using MiraLAX as well as fiber.  At the time had his MiraLAX increased to twice daily and also added magnesium oxide to his regimen.     Physical Exam   Triage Vital Signs: ED Triage Vitals  Encounter Vitals Group     BP 11/07/23 1453 130/89     Systolic BP Percentile --      Diastolic BP Percentile --      Pulse Rate 11/07/23 1453 69     Resp 11/07/23 1453 19     Temp 11/07/23 1453 98.3 F (36.8 C)     Temp Source 11/07/23 1453 Oral     SpO2 11/07/23 1453 100 %     Weight 11/07/23 1510 205 lb (93 kg)     Height 11/07/23 1510 6\' 1"  (1.854 m)     Head Circumference --      Peak Flow --      Pain Score 11/07/23 1508 5     Pain Loc --      Pain Education --      Exclude from Growth Chart --      Most recent vital signs: Vitals:   11/07/23 1453  BP: 130/89  Pulse: 69  Resp: 19  Temp: 98.3 F (36.8 C)  SpO2: 100%     General: Awake, no distress.  CV:  Good peripheral perfusion.  Resp:  Normal effort. Abd:  No distention.  Soft, very mildly tender in the right lower quadrant without any guarding, no testicular tenderness on exam, no palpable hernia Other:  No midline spinal tenderness, no CVA tenderness bilaterally, no tenderness to his back or flank, no saddle anesthesia, he is equal DP pulses bilaterally, no focal weakness or numbness.   ED Results / Procedures / Treatments   Labs (all labs ordered are listed, but only abnormal results are displayed) Labs Reviewed  COMPREHENSIVE METABOLIC PANEL WITH GFR - Abnormal; Notable for the following components:      Result Value   Glucose, Bld 110 (*)    All other components within normal limits  URINALYSIS, ROUTINE W REFLEX MICROSCOPIC - Abnormal; Notable for the following components:   Color, Urine YELLOW (*)    APPearance CLOUDY (*)    All  other components within normal limits  LIPASE, BLOOD  CBC     RADIOLOGY CT imaging on my independent interpretation without obvious obstruction   PROCEDURES:  Critical Care performed: No  Procedures   MEDICATIONS ORDERED IN ED: Medications  iohexol (OMNIPAQUE) 300 MG/ML solution 100 mL (100 mLs Intravenous Contrast Given 11/07/23 1804)     IMPRESSION / MDM / ASSESSMENT AND PLAN / ED COURSE  I reviewed the triage vital signs and the nursing notes.                              Differential diagnosis includes, but is not limited to, partial bowel obstruction, constipation, nephrolithiasis, UTI, musculoskeletal pain, colitis, diverticulitis.  Get CT abdomen pelvis, labs, UA.  Patient's presentation is most consistent with acute presentation with potential threat to life or bodily function.  Independent review of labs and imaging below.  Shared decision making done  with patient and wife, they are agreeable plan for discharge, discussed imaging and lab results including incidental findings, discussed following up with his primary care doctor for further management of the incidental findings.  Otherwise he is well-appearing, okay for outpatient management.  Considered but no indication for inpatient admission at this time.  Will give him a prescription for Dulcolax.  Discussed high-fiber diet including eating more fruits and vegetables, trying prune juice.  He is agreeable plan.  Will discharge with strict return precautions.  Clinical Course as of 11/07/23 2150  Tue Nov 07, 2023  1610 Independent review of labs, UA not consistent with UTI, electrolyte severely deranged, LFTs are not elevated, lipase is normal, no leukocytosis. [TT]  2137 CT ABDOMEN PELVIS W CONTRAST IMPRESSION: 1. No acute intra-abdominal pathology identified. No definite radiographic explanation for the patient's reported symptoms. 2. Moderate severe pancolonic diverticulosis without evidence of acute diverticulitis. 3. Mild hepatic steatosis. 4. Enlarging subcutaneous fluid collections within the dorsal subcutaneous soft tissues at the thoracolumbar junction. These are nonspecific but may represent sebaceous cysts. Correlation with physical examination is recommended.   [TT]    Clinical Course User Index [TT] Drenda Gentle Richard Champion, MD     FINAL CLINICAL IMPRESSION(S) / ED DIAGNOSES   Final diagnoses:  Abdominal pain, unspecified abdominal location  Hepatic steatosis  Diverticulosis     Rx / DC Orders   ED Discharge Orders          Ordered    bisacodyl (DULCOLAX) 5 MG EC tablet  Daily PRN        11/07/23 2139             Note:  This document was prepared using Dragon voice recognition software and may include unintentional dictation errors.    Shane Darling, MD 11/07/23 2150

## 2023-11-07 NOTE — ED Notes (Signed)
 See triage note Presents with some abd pain  States he has had some issues with constipation  Has been seen by PCP   states he has had a change with medication   Only small BM this am

## 2023-11-07 NOTE — ED Triage Notes (Signed)
 Pt presents to the ED POV from home for abdominal pain, lower back pain, and constipation x2 months. Pt reports seeing PCP for this about a month ago and PCP increased dose of miralax, started pt on Prilosec, and magnesium. Pt reports continued abdominal pain and was unable to get a CT scan scheduled until May.  Pt states that it is generalized burning pain. Pt reports a small BM this morning.

## 2023-11-08 ENCOUNTER — Other Ambulatory Visit: Payer: Self-pay | Admitting: Gastroenterology

## 2023-11-08 ENCOUNTER — Telehealth: Payer: Self-pay | Admitting: Gastroenterology

## 2023-11-08 MED ORDER — OMEPRAZOLE 40 MG PO CPDR: 40.0000 mg | DELAYED_RELEASE_CAPSULE | Freq: Every day | ORAL | 2 refills | Status: DC

## 2023-11-08 NOTE — Telephone Encounter (Signed)
 Patient called to ask if the appt on 5/14 for the CT scan could be cancelled.  He went and had done yesterday per his PCP.

## 2023-11-08 NOTE — Telephone Encounter (Signed)
 FYI. It is in epic.

## 2023-11-08 NOTE — Telephone Encounter (Signed)
 LMOM for pt to call office

## 2023-11-08 NOTE — Telephone Encounter (Signed)
Spoke to pt, informed him of recommendations. Pt voiced understanding.

## 2023-11-21 ENCOUNTER — Ambulatory Visit: Admitting: Dermatology

## 2023-12-06 ENCOUNTER — Ambulatory Visit (HOSPITAL_COMMUNITY)

## 2024-01-16 ENCOUNTER — Encounter: Payer: Self-pay | Admitting: Gastroenterology

## 2024-01-16 ENCOUNTER — Ambulatory Visit (INDEPENDENT_AMBULATORY_CARE_PROVIDER_SITE_OTHER): Admitting: Gastroenterology

## 2024-01-16 VITALS — BP 111/74 | HR 71 | Temp 97.8°F | Ht 73.0 in | Wt 193.6 lb

## 2024-01-16 DIAGNOSIS — K219 Gastro-esophageal reflux disease without esophagitis: Secondary | ICD-10-CM

## 2024-01-16 DIAGNOSIS — R1030 Lower abdominal pain, unspecified: Secondary | ICD-10-CM

## 2024-01-16 DIAGNOSIS — Z8719 Personal history of other diseases of the digestive system: Secondary | ICD-10-CM

## 2024-01-16 DIAGNOSIS — K581 Irritable bowel syndrome with constipation: Secondary | ICD-10-CM | POA: Diagnosis not present

## 2024-01-16 DIAGNOSIS — R110 Nausea: Secondary | ICD-10-CM

## 2024-01-16 DIAGNOSIS — R140 Abdominal distension (gaseous): Secondary | ICD-10-CM

## 2024-01-16 MED ORDER — OMEPRAZOLE 40 MG PO CPDR: 40.0000 mg | DELAYED_RELEASE_CAPSULE | Freq: Every day | ORAL | 3 refills | Status: AC

## 2024-01-16 NOTE — Patient Instructions (Addendum)
 Constipation: Continue benefiber 1 tablespoon once daily.  While traveling you could consider increasing to 2 or 3 times a day given dietary change during that time. Continue MiraLAX half a capful daily.  You may titrate this as tolerated or how you need to to ensure daily bowel movement.   For reflux: Continue omeprazole  40 mg once daily, at least 30 minutes prior to meal.  I have sent in a refill for you today. You may use famotidine 10 or 20 mg or Tums as needed for breakthrough. Follow a GERD diet:  Avoid/limit fried, fatty, greasy, spicy, citrus foods. Avoid/limit caffeine and carbonated beverages. Avoid/limit chocolate. Try eating 4-6 small meals a day rather than 3 large meals. Do not eat within 3 hours of laying down. Prop head of bed up on wood or bricks to create a 6 inch incline.  We will see you for follow-up in 1 year or sooner if needed.  Glad you are feeling better!  It was a pleasure to see you today. I want to create trusting relationships with patients. If you receive a survey regarding your visit,  I greatly appreciate you taking time to fill this out on paper or through your MyChart. I value your feedback.  Charmaine Melia, MSN, FNP-BC, AGACNP-BC [facility]

## 2024-01-16 NOTE — Progress Notes (Signed)
 GI Office Note    Referring Provider: Katrinka Aquas, MD Primary Care Physician:  Katrinka Aquas, MD Primary Gastroenterologist: Lamar HERO.Rourk, MD  Date:  01/16/2024  ID:  Javier Archer, Javier Archer 02-28-1951, MRN [id number]   Chief Complaint   Chief Complaint  Patient presents with   Follow-up    Follow up. No problems    History of Present Illness  Javier Archer is a 73 y.o. male with a history of dysplastic nevus, allergies, GERD, and constipation presenting today for follow-up of constipation and abdominal pain.  No complaints today.  Last colonoscopy: about 2 years ago in [place] (Dr. Towana) - no polyps this one, prior to this he has had polyps). Does have diverticulosis. May have had one polyp the one 5 years prior to that  Seen by Dr. Jinny last in January 2022 for reflux.  Had recently been to the ED for symptoms consistent with GERD and treated with Carafate  and Prilosec and reported his symptoms had completely resolved and he had come off medication without any return of symptoms.  He did report a history of hiatal hernia and due for colonoscopy due to personal history of colon polyps.  Advised that he would check when he was due for colonoscopy and will be set up for EGD at that time.  Does not appear that patient was set up for colonoscopy or EGD.   Per referral paperwork, during AWV he mentioned constipation, currently taking MiraLAX and Benefiber.  He requested additional medication for as needed constipation.  He reported he was drinking plenty of fluids and eating a well-rounded diet.  States he previously was encouraged to follow-up primary care provider regarding this and he was recommended to try Dulcolax to help stimulate his bowels and to continue MiraLAX and Benefiber.  GI referral given.  Last office visit 10/17/2023.  Doing well with MiraLAX and fiber.  Started having pain in his bellybutton and was feeling constipated about 5 to 6 weeks prior.  Had tried Dulcolax  occasionally and having lower/abdominal pain.  Pain subsides with laying on his stomach does usually okay until midday but then pain begins around 4 PM.  No vomiting but some nausea.  Causing some issues with eating in the afternoons.  Still having to strain.  Had significant diarrhea with Linzess.  Having some burning and belching and used to be on Prilosec but had not taken in a while.  Advise increase of MiraLAX to twice daily, take magnesium oxide 400 mg daily.  Ordered H. pylori breath test advised to start omeprazole  20 mg once daily thereafter.  Encouraged Benefiber and increase water intake.  H. pylori negative.  Gave patient many bowel prep instructions.  Continue to have some issues with lower abdominal pain therefore ordered CT scan and advised to increase omeprazole  from 20 to 40 mg.  ED visit April 2025.  Presented for complaint of abdominal pain.  CT scan ordered.  They discussed high-fiber diet including trying prunes as well as given prescription for Dulcolax.  Labs unremarkable.  CT A/P with contrast 11/07/23: - No acute intra-abdominal pathology identified. No definite radiographic explanation for the patient's reported symptoms. - Moderate severe pancolonic diverticulosis without evidence of acute diverticulitis. - Mild hepatic steatosis. - Enlarging subcutaneous fluid collections within the dorsal subcutaneous soft tissues at the thoracolumbar junction. These are nonspecific but may represent sebaceous cysts. Correlation with physical examination is recommended.   Today:  Things are much better currently. Having a daily BM and having  less pain and gas. When he went out of town he got thrown off a little but most of the time firm and sometimes a little loose but overall back to his baseline. Currently taking 1 tablespoon benefiber once daily and then maybe a half capful once daily. Not taking dulcolax currently.   Taking the once daily prilosec and occasionlly will have  indigestion and that has helped a lot. No swallowing isseus. Burnign and belchign has significant improved.   No melena or brbpr.   Wt Readings from Last 3 Encounters:  01/16/24 193 lb 9.6 oz (87.8 kg)  11/07/23 205 lb (93 kg)  10/17/23 202 lb (91.6 kg)    Current Outpatient Medications  Medication Sig Dispense Refill   bisacodyl (DULCOLAX) 5 MG EC tablet Take 1 tablet (5 mg total) by mouth daily as needed for moderate constipation. 30 tablet 0   Calcium 200 MG TABS Calcium     Omega-3 Fatty Acids (FISH OIL) 300 MG CAPS Fish Oil     omeprazole  (PRILOSEC) 40 MG capsule Take 1 capsule (40 mg total) by mouth daily. 30 capsule 2   polyethylene glycol (MIRALAX) 17 g packet MiraLax     Wheat Dextrin (BENEFIBER DRINK MIX PO) Benefiber     No current facility-administered medications for this visit.    Past Medical History:  Diagnosis Date   Hx of dysplastic nevus 05/24/2018    right low back spinal inf, moderate   Hx of dysplastic nevus 05/24/2018   right low back 6.0 cm lat to spine, moderate   Hx of dysplastic nevus 05/24/2018   right low back 7.5 cm lat to spine   Seasonal allergies     Past Surgical History:  Procedure Laterality Date   CHOLECYSTECTOMY  2000   HERNIA REPAIR  2016    No family history on file.  Allergies as of 01/16/2024   (No Known Allergies)    Social History   Socioeconomic History   Marital status: Married    Spouse name: Shafiq Larch   Number of children: Not on file   Years of education: Not on file   Highest education level: Not on file  Occupational History   Not on file  Tobacco Use   Smoking status: Never   Smokeless tobacco: Never  Substance and Sexual Activity   Alcohol use: Yes   Drug use: Never   Sexual activity: Not on file  Other Topics Concern   Not on file  Social History Narrative   Not on file   Social Drivers of Health   Financial Resource Strain: Not on file  Food Insecurity: Not on file  Transportation Needs:  Not on file  Physical Activity: Not on file  Stress: Not on file  Social Connections: Not on file     Review of Systems   Gen: Denies fever, chills, anorexia. Denies fatigue, weakness, weight loss.  CV: Denies chest pain, palpitations, syncope, peripheral edema, and claudication. Resp: Denies dyspnea at rest, cough, wheezing, coughing up blood, and pleurisy. GI: See HPI Derm: Denies rash, itching, dry skin Psych: Denies depression, anxiety, memory loss, confusion. No homicidal or suicidal ideation.  Heme: Denies bruising, bleeding, and enlarged lymph nodes.  Physical Exam   BP 111/74 (BP Location: Right Arm, Patient Position: Sitting, Cuff Size: Normal)   Pulse 71   Temp 97.8 F (36.6 C) (Temporal)   Ht 6' 1 (1.854 m)   Wt 193 lb 9.6 oz (87.8 kg)   BMI 25.54 kg/m  General:   Alert and oriented. No distress noted. Pleasant and cooperative.  Head:  Normocephalic and atraumatic. Eyes:  Conjuctiva clear without scleral icterus. Mouth:  Oral mucosa pink and moist. Good dentition. No lesions. Abdomen:  +BS, soft, non-tender and non-distended. No rebound or guarding. No HSM or masses noted. Rectal: deferred Msk:  Symmetrical without gross deformities. Normal posture. Extremities:  Without edema. Neurologic:  Alert and  oriented x4 Psych:  Alert and cooperative. Normal mood and affect.  Assessment  JEMARIO POITRAS is a 74 y.o. male presenting today for follow-up of constipation, abdominal pain, and GERD.  IBS with constipation: - Previously having lower abdominal pain with back pain and decreased bowel frequency - Also previously with incomplete emptying. - Given the abdominal pain he underwent CT scan during ED visit without any acute findings.  Did have moderate to severe pancolonic diverticulosis - Completed mini bowel prep with some improvement but no significant improvement. - Currently using 1 tablespoon of Benefiber as well as a half a capful of MiraLAX and now having  daily bowel movements with no further abdominal pain. - Has had some mild worsening constipation with travel, we discussed the methods to combat this today including increasing fiber and or increasing MiraLAX prior to travel.  Bloating, nausea, GERD:  - Doing well with omeprazole  40 mg once daily. - Has had resolution of nausea, bloating, and issues with appetite. - Ruled out H. pylori with negative breath test. - Reinforced GERD diet today - Will continue on omeprazole  40 mg once daily and advised Pepcid as needed. - If he needs PPI long-term, we discussed performing upper endoscopy for screening for Barrett's.  PLAN   Omeprazole  40 mg once daily. Refilled.  Famotidine as needed GERD diet Continue miralax 1/2 - 1 capful daily Benefiber 1 tablespoon, 1 - 3 times daily as tolerated.  Discussed methods to help with fluctuating bowel habits with travel. Follow-up in 1 year, sooner if needed    Charmaine Melia, MSN, FNP-BC, AGACNP-BC [facility]
